# Patient Record
Sex: Female | Born: 1995 | Race: Black or African American | Hispanic: No | Marital: Married | Smoking: Never smoker
Health system: Southern US, Community
[De-identification: ages and names within clinical notes are randomized; demographics above are authoritative.]

## PROBLEM LIST (undated history)

## (undated) ENCOUNTER — Inpatient Hospital Stay (HOSPITAL_COMMUNITY): Payer: Self-pay

## (undated) DIAGNOSIS — N159 Renal tubulo-interstitial disease, unspecified: Secondary | ICD-10-CM

## (undated) DIAGNOSIS — E039 Hypothyroidism, unspecified: Secondary | ICD-10-CM

## (undated) DIAGNOSIS — Z789 Other specified health status: Secondary | ICD-10-CM

## (undated) DIAGNOSIS — E049 Nontoxic goiter, unspecified: Secondary | ICD-10-CM

## (undated) DIAGNOSIS — R519 Headache, unspecified: Secondary | ICD-10-CM

## (undated) HISTORY — DX: Other specified health status: Z78.9

## (undated) HISTORY — PX: WISDOM TOOTH EXTRACTION: SHX21

---
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
## 2013-01-02 ENCOUNTER — Inpatient Hospital Stay (HOSPITAL_COMMUNITY): Payer: Medicaid Other

## 2013-01-02 ENCOUNTER — Encounter (HOSPITAL_COMMUNITY): Payer: Self-pay | Admitting: *Deleted

## 2013-01-02 ENCOUNTER — Inpatient Hospital Stay (HOSPITAL_COMMUNITY)
Admission: AD | Admit: 2013-01-02 | Discharge: 2013-01-02 | Disposition: A | Discharge status: Home / Self Care | Payer: Medicaid Other | Source: Ambulatory Visit | Attending: Obstetrics & Gynecology | Admitting: Obstetrics & Gynecology

## 2013-01-02 DIAGNOSIS — O0932 Supervision of pregnancy with insufficient antenatal care, second trimester: Secondary | ICD-10-CM

## 2013-01-02 DIAGNOSIS — R109 Unspecified abdominal pain: Secondary | ICD-10-CM | POA: Insufficient documentation

## 2013-01-02 DIAGNOSIS — O26879 Cervical shortening, unspecified trimester: Secondary | ICD-10-CM | POA: Insufficient documentation

## 2013-01-02 DIAGNOSIS — O99891 Other specified diseases and conditions complicating pregnancy: Secondary | ICD-10-CM | POA: Insufficient documentation

## 2013-01-02 DIAGNOSIS — O26872 Cervical shortening, second trimester: Secondary | ICD-10-CM

## 2013-01-02 DIAGNOSIS — N949 Unspecified condition associated with female genital organs and menstrual cycle: Secondary | ICD-10-CM | POA: Insufficient documentation

## 2013-01-02 DIAGNOSIS — O0930 Supervision of pregnancy with insufficient antenatal care, unspecified trimester: Secondary | ICD-10-CM | POA: Insufficient documentation

## 2013-01-02 DIAGNOSIS — O4700 False labor before 37 completed weeks of gestation, unspecified trimester: Secondary | ICD-10-CM

## 2013-01-02 HISTORY — DX: Nontoxic goiter, unspecified: E04.9

## 2013-01-02 LAB — WET PREP, GENITAL
Clue Cells Wet Prep HPF POC: NONE SEEN
Trich, Wet Prep: NONE SEEN

## 2013-01-02 LAB — URINALYSIS, ROUTINE W REFLEX MICROSCOPIC
Glucose, UA: NEGATIVE mg/dL
Ketones, ur: NEGATIVE mg/dL
Protein, ur: NEGATIVE mg/dL
Urobilinogen, UA: 0.2 mg/dL (ref 0.0–1.0)

## 2013-01-02 LAB — URINE MICROSCOPIC-ADD ON

## 2013-01-02 MED ORDER — PROGESTERONE 100 MG VA SUPP: 1.0000 | Freq: Every day | VAGINAL | Status: DC

## 2013-01-02 NOTE — MAU Note (Signed)
Started cramping during the night, got worse middle of the night. No bleeding.  Passed ? Mucous plug yesterday, greenish.

## 2013-01-02 NOTE — MAU Provider Note (Signed)
History     CSN: [id number]  Arrival date and time: 01/02/13 0949   None     Chief Complaint  Patient presents with  . Vaginal Discharge  . Abdominal Pain   HPI 17 y.o.  G3P0011 at [redacted]w[redacted]d with cramping starting last night. Has had it on and off for a few weeks. Lower pelvic cramping. Lost mucous plug today. Lasts a few minutes at a time. Through the night. No fever/chills. No nausea/vomiting. No diarrhea/constipation.   First pregnancy normal with vaginal delivery at around 41 weeks. In [place]. Baby had heart issues and seizures but otherwise no complications.  LMP:  08/16/13, regular, 7 days.   OB History   Grav Para Term Preterm Abortions TAB SAB Ect Mult Living   3    1  1   1       Past Medical History  Diagnosis Date  . Complication of anesthesia   . Enlarged thyroid     Past Surgical History  Procedure Laterality Date  . Wisdom tooth extraction      Family History  Problem Relation Age of Onset  . Cancer Mother   . Cancer Maternal Aunt   . Cancer Maternal Grandmother     History  Substance Use Topics  . Smoking status: Never Smoker   . Smokeless tobacco: Never Used  . Alcohol Use: No    Allergies: No Known Allergies  Prescriptions prior to admission  Medication Sig Dispense Refill  . sodium chloride (OCEAN) 0.65 % nasal spray Place 1 spray into the nose as needed for congestion.        ROS  Pertinent pos and neg listed in HPI  Physical Exam   Blood pressure 123/58, pulse 83, temperature 98.3 F (36.8 C), temperature source Oral, resp. rate 18, height 6' (1.829 m), weight 90.719 kg (200 lb), last menstrual period 08/16/2012.  Physical Exam  Constitutional: She is oriented to person, place, and time. She appears well-developed and well-nourished. No distress.  HENT:  Head: Normocephalic and atraumatic.  Eyes: Conjunctivae and EOM are normal.  Neck: Normal range of motion. Neck supple.  Cardiovascular: Normal rate, regular rhythm and normal  heart sounds.   Respiratory: Effort normal and breath sounds normal. No respiratory distress.  GI: Soft. Bowel sounds are normal. There is no tenderness. There is no rebound and no guarding.  Musculoskeletal: She exhibits no edema and no tenderness.  Neurological: She is alert and oriented to person, place, and time.  Skin: Skin is warm and dry.  Psychiatric: She has a normal mood and affect.   FHTs by handheld doppler:  152  Results for orders placed during the hospital encounter of 01/02/13 (from the past 24 hour(s))  URINALYSIS, ROUTINE W REFLEX MICROSCOPIC     Status: Abnormal   Collection Time    01/02/13 10:07 AM      Result Value Range   Color, Urine YELLOW  YELLOW   APPearance CLEAR  CLEAR   Specific Gravity, Urine 1.025  1.005 - 1.030   pH 6.5  5.0 - 8.0   Glucose, UA NEGATIVE  NEGATIVE mg/dL   Hgb urine dipstick NEGATIVE  NEGATIVE   Bilirubin Urine NEGATIVE  NEGATIVE   Ketones, ur NEGATIVE  NEGATIVE mg/dL   Protein, ur NEGATIVE  NEGATIVE mg/dL   Urobilinogen, UA 0.2  0.0 - 1.0 mg/dL   Nitrite NEGATIVE  NEGATIVE   Leukocytes, UA SMALL (*) NEGATIVE  URINE MICROSCOPIC-ADD ON     Status: None   Collection  Time    01/02/13 10:07 AM      Result Value Range   Squamous Epithelial / LPF RARE  RARE   WBC, UA 0-2  <3 WBC/hpf   RBC / HPF 0-2  <3 RBC/hpf   Bacteria, UA RARE  RARE   Urine-Other MUCOUS PRESENT    WET PREP, GENITAL     Status: Abnormal   Collection Time    01/02/13 11:35 AM      Result Value Range   Yeast Wet Prep HPF POC NONE SEEN  NONE SEEN   Trich, Wet Prep NONE SEEN  NONE SEEN   Clue Cells Wet Prep HPF POC NONE SEEN  NONE SEEN   WBC, Wet Prep HPF POC MANY (*) NONE SEEN     MAU Course  Procedures   Assessment and Plan  17 y.o. .[id number] at [redacted]w[redacted]d by LMP with no prenatal care: - Lower pelvic pain/cramping with shortened cervix - F/U GC, chlamydia - Scheduled for detailed OB sono tomorrow 10 AM - Scheduled for [facility] clinic appt 5/12 (Monday) at 10:15  AM. - Prometrium vaginally qhs.  May be candidate for cerclage. - Discussed risks of short cervix and preterm delivery. Pt instructed about pelvic rest, no prolonged standing/lifting/bending until further notice.  - Discharge home.   Napoleon Form 01/02/2013, 11:22 AM

## 2013-01-03 ENCOUNTER — Encounter (HOSPITAL_COMMUNITY): Payer: Self-pay | Admitting: *Deleted

## 2013-01-03 ENCOUNTER — Inpatient Hospital Stay (HOSPITAL_COMMUNITY)
Admission: AD | Admit: 2013-01-03 | Discharge: 2013-01-04 | DRG: 782 | Disposition: A | Discharge status: Home / Self Care | Payer: Medicaid Other | Source: Ambulatory Visit | Attending: Obstetrics & Gynecology | Admitting: Obstetrics & Gynecology

## 2013-01-03 ENCOUNTER — Ambulatory Visit (HOSPITAL_COMMUNITY)
Admit: 2013-01-03 | Discharge: 2013-01-03 | Disposition: A | Discharge status: Home / Self Care | Payer: Medicaid Other | Attending: Family Medicine | Admitting: Family Medicine

## 2013-01-03 ENCOUNTER — Other Ambulatory Visit (HOSPITAL_COMMUNITY): Payer: Self-pay | Admitting: Family Medicine

## 2013-01-03 DIAGNOSIS — O0930 Supervision of pregnancy with insufficient antenatal care, unspecified trimester: Secondary | ICD-10-CM

## 2013-01-03 DIAGNOSIS — O0932 Supervision of pregnancy with insufficient antenatal care, second trimester: Secondary | ICD-10-CM

## 2013-01-03 DIAGNOSIS — O3432 Maternal care for cervical incompetence, second trimester: Secondary | ICD-10-CM

## 2013-01-03 DIAGNOSIS — O26879 Cervical shortening, unspecified trimester: Principal | ICD-10-CM

## 2013-01-03 DIAGNOSIS — O3430 Maternal care for cervical incompetence, unspecified trimester: Secondary | ICD-10-CM | POA: Diagnosis present

## 2013-01-03 LAB — DIFFERENTIAL
Basophils Absolute: 0 10*3/uL (ref 0.0–0.1)
Basophils Relative: 0 % (ref 0–1)
Eosinophils Absolute: 0.2 10*3/uL (ref 0.0–0.7)
Monocytes Absolute: 0.7 10*3/uL (ref 0.1–1.0)
Monocytes Relative: 6 % (ref 3–12)
Neutrophils Relative %: 75 % (ref 43–77)

## 2013-01-03 LAB — CBC
Hemoglobin: 11.4 g/dL — ABNORMAL LOW (ref 12.0–15.0)
MCH: 31.2 pg (ref 26.0–34.0)
MCHC: 34 g/dL (ref 30.0–36.0)
RDW: 13.8 % (ref 11.5–15.5)

## 2013-01-03 LAB — TYPE AND SCREEN: Antibody Screen: NEGATIVE

## 2013-01-03 LAB — OB RESULTS CONSOLE GC/CHLAMYDIA
Chlamydia: NEGATIVE
Gonorrhea: NEGATIVE

## 2013-01-03 LAB — GC/CHLAMYDIA PROBE AMP: GC Probe RNA: NEGATIVE

## 2013-01-03 IMAGING — US US OB COMP +14 WK
2 series · 12 of 28 positions shown · non-contrast
Comparison: none

[Series 1: us ob comp +14 wk · 10 of 87 slices shown (1 of 2)]
[im 4/87]
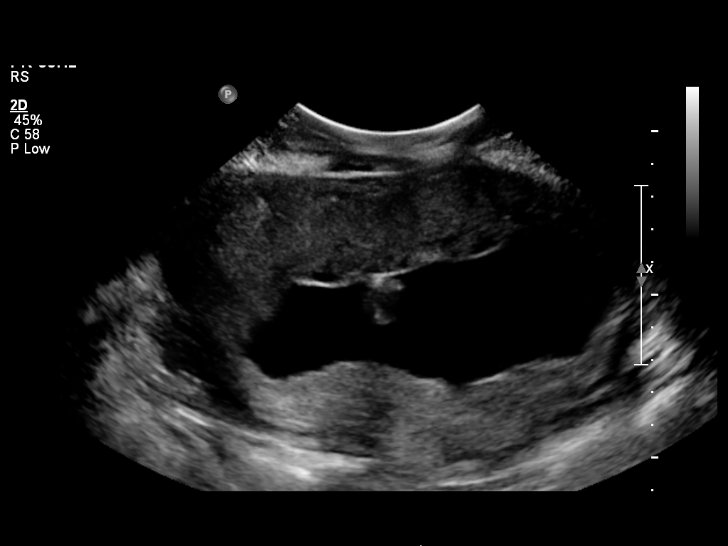
[im 12/87]
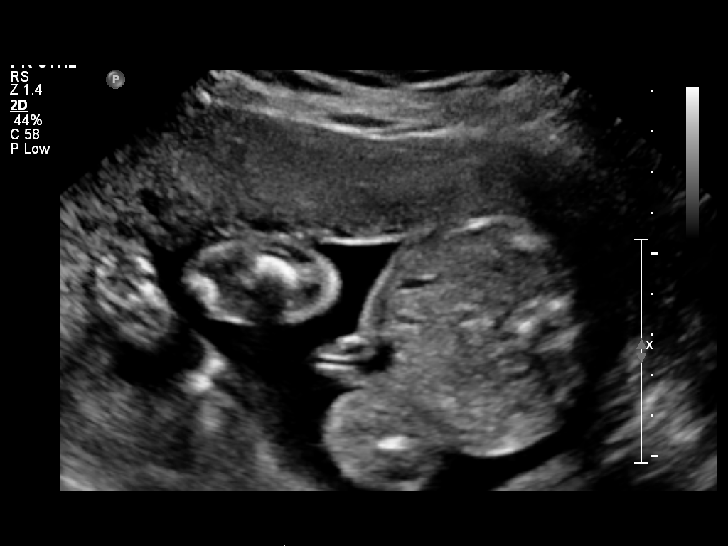
[im 19/87]
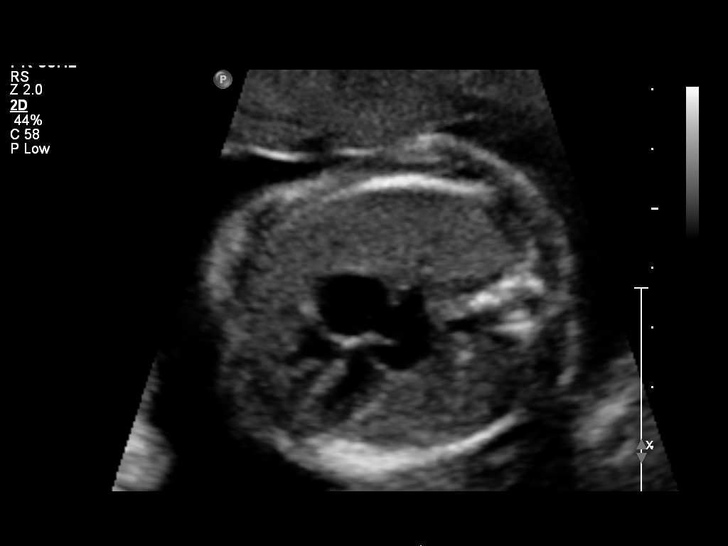
[im 30/87]
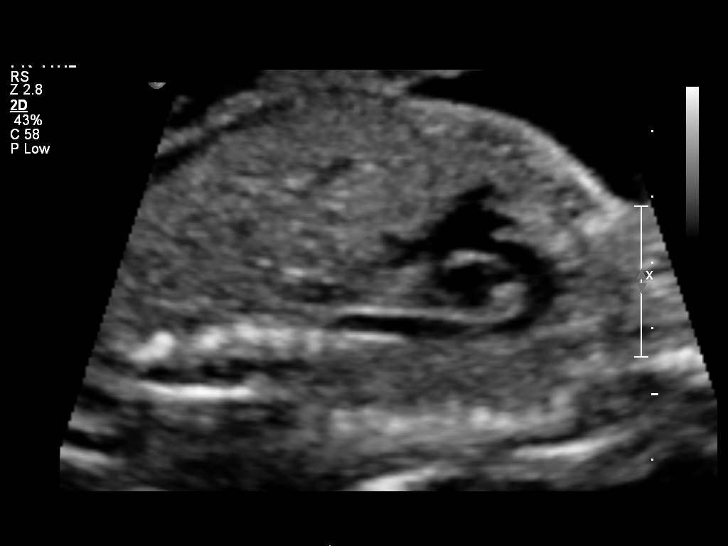
[im 38/87]
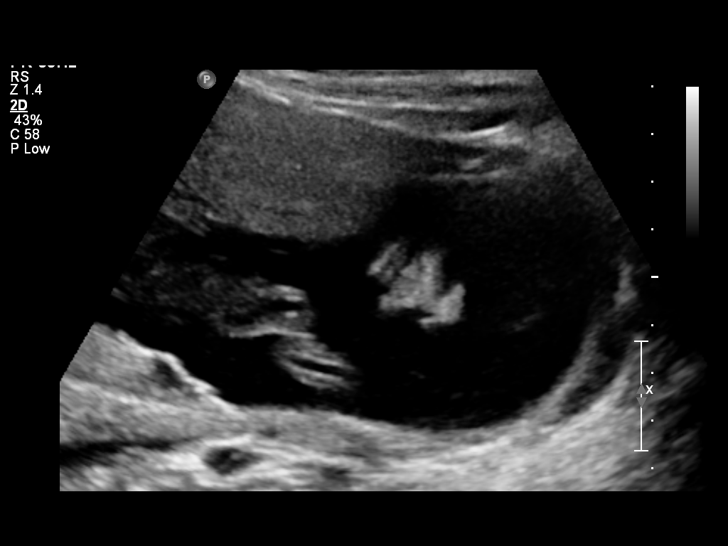
[im 45/87]
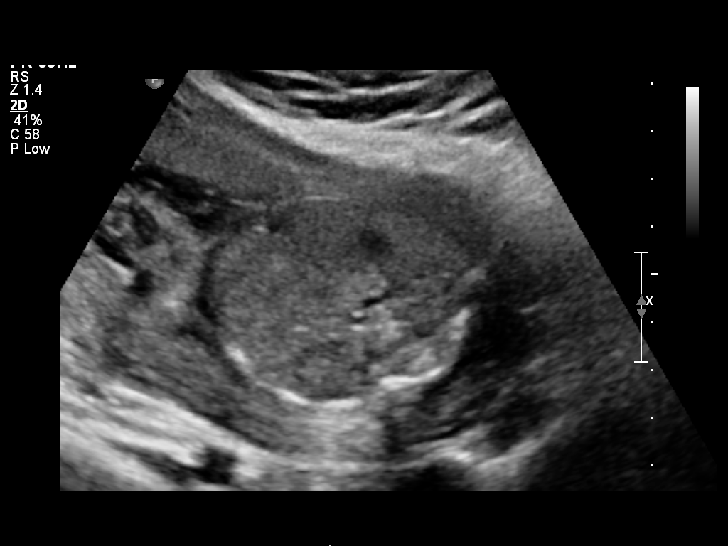
[im 57/87]
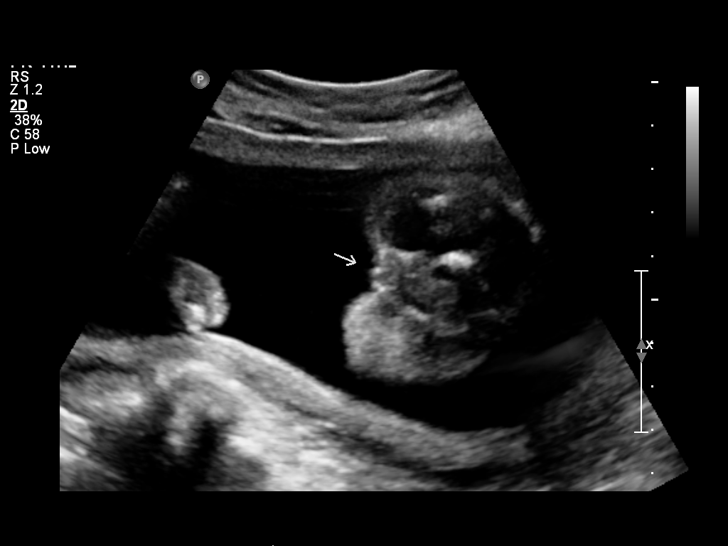
[im 64/87]
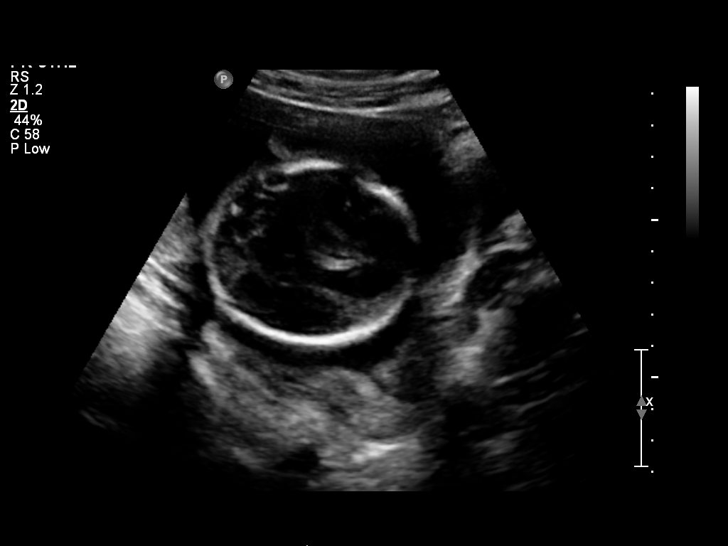
[im 72/87]
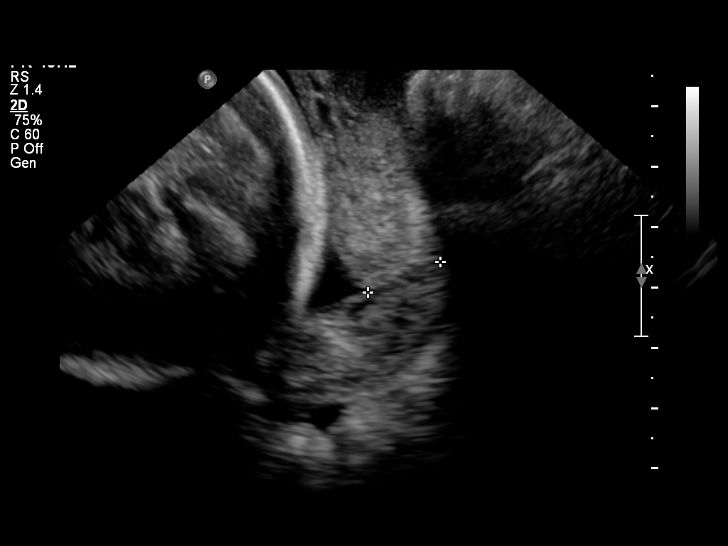
[im 83/87]
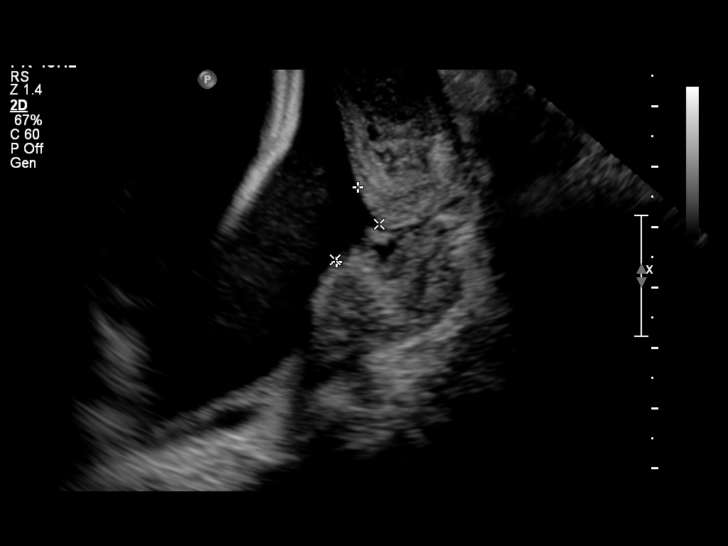

[Series 1: us ob comp +14 wk · 2 of 15 slices shown (2 of 2)]
[im 1/15]
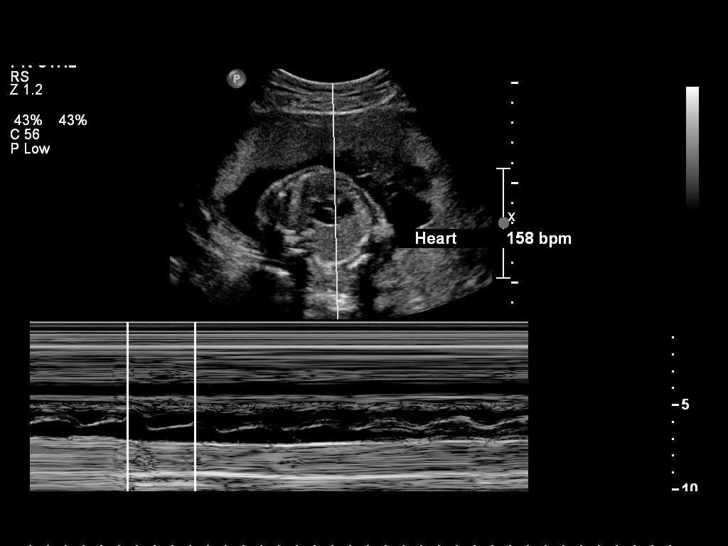
[im 10/15]
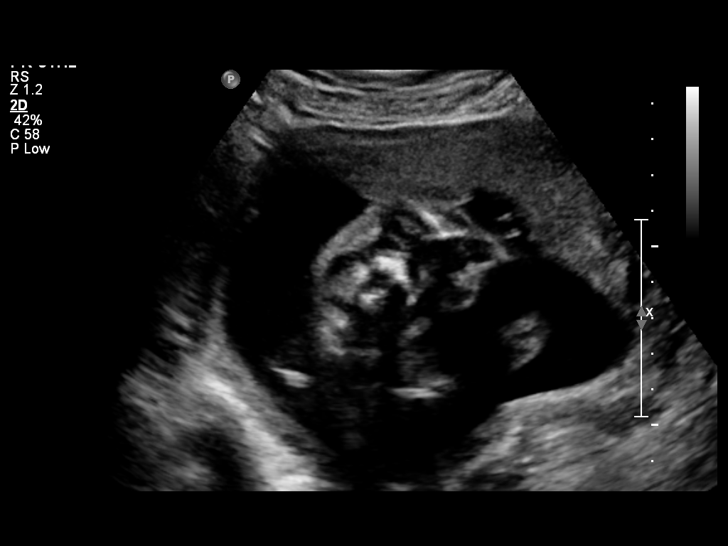

[12 of 28 positions shown; findings below may reference images not displayed]

OBSTETRICS REPORT
                      (Signed Final [DATE] [DATE])

Service(s) Provided

 US OB COMP + 14 WK                                    76805.1
 US OB TRANSVAGINAL                                    76817.0
Indications

 Unsure of LMP;  Establish Gestational [AGE]
 No or Little Prenatal Care                            [MR]
 Cervical shortening
Fetal Evaluation

 Num Of Fetuses:    1
 Fetal Heart Rate:  158                         bpm
 Cardiac Activity:  Observed
 Presentation:      Cephalic
 Placenta:          Anterior, above cervical os
 P. Cord            Visualized, central
 Insertion:

 Amniotic Fluid
 AFI FV:      Subjectively within normal limits
                                             Larg Pckt:     4.8  cm
Biometry

 BPD:     57.3  mm    G. Age:   23w 4d                CI:        74.32   70 - 86
                                                      FL/HC:      20.0   18.7 -

 HC:       211  mm    G. Age:   23w 1d       17  %    HC/AC:      1.06   1.05 -

 AC:     198.6  mm    G. Age:   24w 4d       67  %    FL/BPD:     73.8   71 - 87
 FL:      42.3  mm    G. Age:   23w 6d       41  %    FL/AC:      21.3   20 - 24

 Est. FW:     657  gm      1 lb 7 oz     58  %
Gestational Age

 LMP:           20w 0d       Date:   [DATE]                 EDD:   [DATE]
 U/S Today:     23w 5d                                        EDD:   [DATE]
 Best:          23w 5d    Det. By:   U/S ([DATE])           EDD:   [DATE]
Anatomy

 Cranium:          Appears normal         Aortic Arch:      Appears normal
 Fetal Cavum:      Appears normal         Ductal Arch:      Appears normal
 Ventricles:       Appears normal         Diaphragm:        Appears normal
 Choroid Plexus:   Appears normal         Stomach:          Appears normal, left
                                                            sided
 Cerebellum:       Appears normal         Abdomen:          Appears normal
 Posterior Fossa:  Appears normal         Abdominal Wall:   Appears nml (cord
                                                            insert, abd wall)
 Nuchal Fold:      Not applicable (>20    Cord Vessels:     Appears normal (3
                   wks GA)                                  vessel cord)
 Face:             Appears normal         Kidneys:          Appear normal
                   (orbits and profile)
 Lips:             Appears normal         Bladder:          Appears normal
 Heart:            Appears normal         Spine:            Appears normal
                   (4CH, axis, and
                   situs)
 RVOT:             Appears normal         Lower             Appears normal
                                          Extremities:
 LVOT:             Appears normal         Upper             Appears normal
                                          Extremities:

 Other:  Female gender. Heels visualized. Technically difficult due to fetal
         position.
Cervix Uterus Adnexa

 Cervical Length:   1.2       cm       Funnel Width:   1.3       cm
 Funnel Length:     0.9       cm

 Cervix:       Measured transvaginally. Funneling of internal os
               noted.

 Left Ovary:   Within normal limits.
 Right Ovary:  Within normal limits.
Impression

 Single living IUP with US Gest. Age of 23w 5d. This is ahead
 of LMP;  recommend assigned EDD by US of [DATE].
 No fetal anomalies seen involving visualized anatomy.
 Short cervix again noted, measuring 1.2cm in length.

## 2013-01-03 MED ORDER — PRENATAL MULTIVITAMIN CH
1.0000 | ORAL_TABLET | Freq: Every day | ORAL | Status: DC
  Administered 2013-01-03: 1 via ORAL
  Filled 2013-01-03: qty 1

## 2013-01-03 MED ORDER — SODIUM CHLORIDE 0.9 % IJ SOLN: 3.0000 mL | Freq: Two times a day (BID) | INTRAMUSCULAR | Status: DC

## 2013-01-03 MED ORDER — CALCIUM CARBONATE ANTACID 500 MG PO CHEW
2.0000 | CHEWABLE_TABLET | ORAL | Status: DC | PRN
  Filled 2013-01-03: qty 2

## 2013-01-03 MED ORDER — SODIUM CHLORIDE 0.9 % IV SOLN: 250.0000 mL | INTRAVENOUS | Status: DC | PRN

## 2013-01-03 MED ORDER — BETAMETHASONE SOD PHOS & ACET 6 (3-3) MG/ML IJ SUSP
12.0000 mg | INTRAMUSCULAR | Status: AC
  Administered 2013-01-03 – 2013-01-04 (×2): 12 mg via INTRAMUSCULAR
  Filled 2013-01-03 (×2): qty 2

## 2013-01-03 MED ORDER — PRENATAL MULTIVITAMIN CH
1.0000 | ORAL_TABLET | Freq: Every day | ORAL | Status: DC
  Filled 2013-01-03 (×2): qty 1

## 2013-01-03 MED ORDER — DOCUSATE SODIUM 100 MG PO CAPS
100.0000 mg | ORAL_CAPSULE | Freq: Every day | ORAL | Status: DC
  Filled 2013-01-03 (×3): qty 1

## 2013-01-03 MED ORDER — ACETAMINOPHEN 325 MG PO TABS: 650.0000 mg | ORAL_TABLET | ORAL | Status: DC | PRN

## 2013-01-03 MED ORDER — SODIUM CHLORIDE 0.9 % IJ SOLN: 3.0000 mL | INTRAMUSCULAR | Status: DC | PRN

## 2013-01-03 MED ORDER — PROGESTERONE MICRONIZED 200 MG PO CAPS
200.0000 mg | ORAL_CAPSULE | Freq: Every day | ORAL | Status: DC
  Administered 2013-01-03: 200 mg via VAGINAL
  Filled 2013-01-03 (×2): qty 1

## 2013-01-03 MED ORDER — BETAMETHASONE SOD PHOS & ACET 6 (3-3) MG/ML IJ SUSP
12.0000 mg | Freq: Once | INTRAMUSCULAR | Status: DC
  Filled 2013-01-03: qty 2

## 2013-01-03 MED ORDER — ZOLPIDEM TARTRATE 5 MG PO TABS: 5.0000 mg | ORAL_TABLET | Freq: Every evening | ORAL | Status: DC | PRN

## 2013-01-03 NOTE — H&P (Signed)
Gary Fleet   History of Present Illness: Cassandra Smith is a 17 y.o. G3P1011 at [redacted]w[redacted]d admitted for cervical insufficiency. Seen yesterday in MAU for abdominal pain and vaginal discharge. Found to have cervical length of 1.8 cm. Sent home with Rx for prometrium. Scheduled for complete ultrasound today and clinic appt 5/12 to discuss possible cerclage. Dating by patient's LMP gives gestational age of [redacted] wks today. She is sure of LMP - regular periods, tracking, no birth control, LMP was normal in length. However, seen for ultrasound today and dating of [redacted]w[redacted]d. Cervical length today was 1.2 cm. Sent to MAU for further eval.   Today she denies bleeding, cramping, loss of fluid. Fetal movement is normal. NO prenatal care to date. Prior pregnancy with SVD, no complications.  0 Fetal presentation is cephalic by today's sono.  Patient reports the fetal movement as active. Patient reports uterine contraction  activity as none Patient reports  vaginal bleeding as none. Patient describes fluid per vagina as none  Patient Active Problem List   Diagnosis Date Noted  . Late prenatal care 01/03/2013  . Cervical shortening complicating pregnancy 01/03/2013   Past Medical History: Past Medical History  Diagnosis Date  . Complication of anesthesia   . Enlarged thyroid     Past Surgical History: Past Surgical History  Procedure Laterality Date  . Wisdom tooth extraction      Obstetrical History: OB History   Grav Para Term Preterm Abortions TAB SAB Ect Mult Living   3 1 1  0 1 0 1 0 0 1      Social History: History   Social History  . Marital Status: Single    Spouse Name: N/A    Number of Children: N/A  . Years of Education: N/A   Social History Main Topics  . Smoking status: Never Smoker   . Smokeless tobacco: Never Used  . Alcohol Use: No  . Drug Use: No  . Sexually Active: Yes   Other Topics Concern  . None   Social History Narrative  . None    Family  History: Family History  Problem Relation Age of Onset  . Cancer Mother   . Cancer Maternal Aunt   . Cancer Maternal Grandmother     Allergies: No Known Allergies  Prescriptions prior to admission  Medication Sig Dispense Refill  . Prenatal Vit-Fe Fumarate-FA (PRENATAL MULTIVITAMIN) TABS Take 1 tablet by mouth at bedtime.      . Progesterone 100 MG SUPP Place 1 suppository (100 mg total) vaginally at bedtime.  30 each  6    Review of Systems - No fever, chills, nausea, vomiting, diarrhea, headache, vision changes. Other pertinent pos and neg stated in HPI.  Vitals:  LMP 08/16/2012 Filed Vitals:   01/03/13 2027  BP: 117/63  Pulse: 77  Temp:   Resp:     Physical Examination:  GEN:  WNWD, no distress HEENT:  NCAT, EOMI, conjunctiva clear NECK:  Supple, non-tender, no thyromegaly, trachea midline CV: RRR, no murmur RESP:  CTAB ABD:  Soft, non-tender, no guarding or rebound, normal bowel sounds EXTREM:  Warm, well perfused, no edema or tenderness NEURO:  Alert, oriented, no focal deficits GU:  Cervix closed, short (<50%), high, no CMT. Normal external genitalia.  Membranes: intact  Fetal Monitoring:   150 by handheld doppler.  Labs:  Results for orders placed during the hospital encounter of 01/03/13 (from the past 24 hour(s))  HEPATITIS B SURFACE ANTIGEN   Collection Time  01/03/13  1:30 PM      Result Value Range   Hepatitis B Surface Ag NEGATIVE  NEGATIVE  RPR   Collection Time    01/03/13  1:30 PM      Result Value Range   RPR NON REACTIVE  NON REACTIVE  CBC   Collection Time    01/03/13  1:30 PM      Result Value Range   WBC 12.6 (*) 4.0 - 10.5 K/uL   RBC 3.65 (*) 3.87 - 5.11 MIL/uL   Hemoglobin 11.4 (*) 12.0 - 15.0 g/dL   HCT 11.9 (*) 14.7 - 82.9 %   MCV 91.8  78.0 - 100.0 fL   MCH 31.2  26.0 - 34.0 pg   MCHC 34.0  30.0 - 36.0 g/dL   RDW 56.2  13.0 - 86.5 %   Platelets 264  150 - 400 K/uL  DIFFERENTIAL   Collection Time    01/03/13  1:30 PM       Result Value Range   Neutrophils Relative 75  43 - 77 %   Neutro Abs 9.5 (*) 1.7 - 7.7 K/uL   Lymphocytes Relative 18  12 - 46 %   Lymphs Abs 2.2  0.7 - 4.0 K/uL   Monocytes Relative 6  3 - 12 %   Monocytes Absolute 0.7  0.1 - 1.0 K/uL   Eosinophils Relative 1  0 - 5 %   Eosinophils Absolute 0.2  0.0 - 0.7 K/uL   Basophils Relative 0  0 - 1 %   Basophils Absolute 0.0  0.0 - 0.1 K/uL  TYPE AND SCREEN   Collection Time    01/03/13  4:30 PM      Result Value Range   ABO/RH(D) O POS     Antibody Screen NEG     Sample Expiration 01/06/2013    ABO/RH   Collection Time    01/03/13  4:30 PM      Result Value Range   ABO/RH(D) O POS      Imaging Studies: Complete OB ultrasound > 14 wks Cephalic, GA [redacted]w[redacted]d, normal fluid. Cervical length 1.2 cm, funnel 0.9 long x 1.3 cm wide Normal anatomy    . betamethasone acetate-betamethasone sodium phosphate  12 mg Intramuscular Q24H  . betamethasone acetate-betamethasone sodium phosphate  12 mg Intramuscular Once  . docusate sodium  100 mg Oral Daily  . prenatal multivitamin  1 tablet Oral Q1200  . progesterone  200 mg Vaginal QHS  . sodium chloride  3 mL Intravenous Q12H   I have reviewed the patient's medications.  ASSESSMENT: 17 y.o. G3P1011 at [redacted]w[redacted]d with  Patient Active Problem List   Diagnosis Date Noted  . Late prenatal care 01/03/2013  . Cervical shortening complicating pregnancy 01/03/2013   PLAN: Admit to Antenatal Betamethasone Monitor x 24 hours. Recheck cervix tomorrow Prometrium (vaginal progesterone 200 mg nightly) NICU consult Possibly home after BMZ.  Napoleon Form, MD

## 2013-01-04 DIAGNOSIS — IMO0002 Reserved for concepts with insufficient information to code with codable children: Secondary | ICD-10-CM

## 2013-01-04 DIAGNOSIS — O3440 Maternal care for other abnormalities of cervix, unspecified trimester: Secondary | ICD-10-CM

## 2013-01-04 MED ORDER — PROGESTERONE MICRONIZED 200 MG PO CAPS: 200.0000 mg | ORAL_CAPSULE | Freq: Every day | ORAL | Status: DC

## 2013-01-04 NOTE — Discharge Summary (Signed)
Physician Discharge Summary  Patient ID: Cassandra Smith MRN: [id number] DOB/AGE: 1996/01/24 16 y.o.  Admit date: 01/03/2013 Discharge date: 01/04/2013  Admission Diagnoses: Cervical insufficiency   Discharge Diagnoses: Cervical insufficiency  Active Problems:   Late prenatal care   Cervical shortening complicating pregnancy   Discharged Condition: stable  Hospital Course: Cassandra Smith is a 17 y.o. G3P1011 with no PNC at [redacted]w[redacted]d admitted for cervical insufficiency. Seen 5/8 in MAU for abdominal pain and vaginal discharge. Found to have cervical length of 1.8 cm. Sent home with Rx for prometrium. Complete ultrasound 5/9 revealed EGA of [redacted]w[redacted]d and CL of 1.2 cm, so she was admitted to antenatal for BMZ x 2.  Cx on admission was closed.  Repeat SVE today after 2nd dose of BMZ, cx still closed.   Consults: None  Significant Diagnostic Studies: none  Treatments: BMZ x 2, prometrium 200mg  pv q hs  Discharge Exam: Blood pressure 110/59, pulse 74, temperature 98.3 F (36.8 C), temperature source Oral, resp. rate 18, height 6' (1.829 m), weight 90.719 kg (200 lb), last menstrual period 08/16/2012.  Reassuring tracing this am  Pt denies uc's, vb, lof.  Reports good fm.   Disposition: 01-Home or Self Care   Future Appointments Provider Department Dept Phone   01/06/2013 10:15 AM Catalina Antigua, MD [facility] [phone]       Medication List    STOP taking these medications       Progesterone 100 MG Supp      TAKE these medications       prenatal multivitamin Tabs  Take 1 tablet by mouth at bedtime.     progesterone 200 MG capsule  Commonly known as:  PROMETRIUM  Take 1 capsule (200 mg total) by mouth at bedtime. Vaginally (not by mouth)           Follow-up Information   Follow up with [facility]. Schedule an appointment as soon as possible for a visit in 1 week.   Contact information:   [address] [place]  [address] [phone]     Reviewed poc w/ Dr. Marice Potter  Signed: Marge Duncans 01/04/2013, 2:45 PM

## 2013-01-04 NOTE — Progress Notes (Signed)
Patient ID: Cassandra Smith, female   DOB: [birth year], 17 y.o.   MRN: [id number] FACULTY PRACTICE ANTEPARTUM(COMPREHENSIVE) NOTE  Cassandra Smith is a 17 y.o. G3P1011 at [redacted]w[redacted]d  who is admitted for preterm cervical shortening.   Length of Stay:  1  Days  Subjective: No complaints Patient reports the fetal movement as active. Patient reports uterine contraction  activity as none. Patient reports  vaginal bleeding as none. Patient describes fluid per vagina as None.  Vitals:  Blood pressure 117/63, pulse 77, temperature 98.7 F (37.1 C), temperature source Oral, resp. rate 20, last menstrual period 08/16/2012. Physical Examination:  General appearance - alert, well appearing, and in no distress Abdomen - gravid, NT Extremities - no edema, redness or tenderness in the calves or thighs, Homan's sign negative bilaterally  Fetal Monitoring:  Baseline: 150 bpm, Variability: Good {> 6 bpm), Accelerations: Reactive and Decelerations: Absent  Labs:  Results for orders placed during the hospital encounter of 01/03/13 (from the past 24 hour(s))  HEPATITIS B SURFACE ANTIGEN   Collection Time    01/03/13  1:30 PM      Result Value Range   Hepatitis B Surface Ag NEGATIVE  NEGATIVE  RPR   Collection Time    01/03/13  1:30 PM      Result Value Range   RPR NON REACTIVE  NON REACTIVE  CBC   Collection Time    01/03/13  1:30 PM      Result Value Range   WBC 12.6 (*) 4.0 - 10.5 K/uL   RBC 3.65 (*) 3.87 - 5.11 MIL/uL   Hemoglobin 11.4 (*) 12.0 - 15.0 g/dL   HCT 36.6 (*) 44.0 - 34.7 %   MCV 91.8  78.0 - 100.0 fL   MCH 31.2  26.0 - 34.0 pg   MCHC 34.0  30.0 - 36.0 g/dL   RDW 42.5  95.6 - 38.7 %   Platelets 264  150 - 400 K/uL  DIFFERENTIAL   Collection Time    01/03/13  1:30 PM      Result Value Range   Neutrophils Relative 75  43 - 77 %   Neutro Abs 9.5 (*) 1.7 - 7.7 K/uL   Lymphocytes Relative 18  12 - 46 %   Lymphs Abs 2.2  0.7 - 4.0 K/uL   Monocytes Relative 6  3 - 12 %   Monocytes Absolute 0.7  0.1 - 1.0 K/uL   Eosinophils Relative 1  0 - 5 %   Eosinophils Absolute 0.2  0.0 - 0.7 K/uL   Basophils Relative 0  0 - 1 %   Basophils Absolute 0.0  0.0 - 0.1 K/uL  TYPE AND SCREEN   Collection Time    01/03/13  4:30 PM      Result Value Range   ABO/RH(D) O POS     Antibody Screen NEG     Sample Expiration 01/06/2013    ABO/RH   Collection Time    01/03/13  4:30 PM      Result Value Range   ABO/RH(D) O POS      Imaging Studies:    None pending  Medications:  Scheduled . betamethasone acetate-betamethasone sodium phosphate  12 mg Intramuscular Q24H  . docusate sodium  100 mg Oral Daily  . prenatal multivitamin  1 tablet Oral QHS  . progesterone  200 mg Vaginal QHS  . sodium chloride  3 mL Intravenous Q12H   I have reviewed the patient's current medications.  ASSESSMENT: Patient  Active Problem List   Diagnosis Date Noted  . Late prenatal care 01/03/2013  . Cervical shortening complicating pregnancy 01/03/2013     PLAN:  17 y.o. G3P1011 at [redacted]w[redacted]d  who is admitted for preterm cervical shortening.  No evidence of preterm labor.    1-Continue Betamethasone 2-cervical check after 2nd dose 3-Continue Prometrium 4-Consider d/c home if no cervical change 5-NST reative  Lenita Peregrina H. 01/04/2013,6:45 AM

## 2013-01-06 ENCOUNTER — Encounter: Payer: Self-pay | Admitting: Obstetrics and Gynecology

## 2013-01-06 LAB — CULTURE, BETA STREP (GROUP B ONLY)

## 2013-01-06 LAB — RUBELLA SCREEN: Rubella: 2.28 Index — ABNORMAL HIGH (ref <0.90)

## 2013-01-06 NOTE — MAU Provider Note (Signed)
Attestation of Attending Supervision of Advanced Practitioner (CNM/NP/Fellow): Evaluation and management procedures were performed by the Advanced Practitioner under my supervision and collaboration.  I have reviewed the Advanced Practitioner's note and chart, and I agree with the management and plan.  [name], [name] 10:50 AM

## 2013-01-07 ENCOUNTER — Other Ambulatory Visit (HOSPITAL_COMMUNITY): Payer: Self-pay

## 2013-01-09 ENCOUNTER — Ambulatory Visit (INDEPENDENT_AMBULATORY_CARE_PROVIDER_SITE_OTHER): Payer: Medicaid Other | Admitting: Obstetrics & Gynecology

## 2013-01-09 ENCOUNTER — Encounter: Payer: Self-pay | Admitting: Obstetrics & Gynecology

## 2013-01-09 VITALS — BP 122/68 | Temp 98.0°F | Wt 208.2 lb

## 2013-01-09 DIAGNOSIS — O26879 Cervical shortening, unspecified trimester: Secondary | ICD-10-CM

## 2013-01-09 DIAGNOSIS — O0930 Supervision of pregnancy with insufficient antenatal care, unspecified trimester: Secondary | ICD-10-CM

## 2013-01-09 DIAGNOSIS — O26872 Cervical shortening, second trimester: Secondary | ICD-10-CM

## 2013-01-09 LAB — POCT URINALYSIS DIP (DEVICE)
Bilirubin Urine: NEGATIVE
Glucose, UA: NEGATIVE mg/dL
Nitrite: NEGATIVE

## 2013-01-09 NOTE — Progress Notes (Signed)
New OB teen pregnancy, needs PNV  Subjective:    Cassandra Smith is a [id number] [redacted]w[redacted]d being seen today for her first obstetrical visit.  Her obstetrical history is significant for teen pregnancy. Patient does intend to breast feed. Pregnancy history fully reviewed.  Patient reports no complaints.  Filed Vitals:   01/09/13 0958  BP: 122/68  Temp: 98 F (36.7 C)  Weight: 208 lb 3.2 oz (94.439 kg)    HISTORY: OB History   Grav Para Term Preterm Abortions TAB SAB Ect Mult Living   3 1 1  0 1 0 1 0 0 1     # Outc Date GA Lbr Len/2nd Wgt Sex Del Anes PTL Lv   1 TRM 11/12 [redacted]w[redacted]d  6lb4oz(2.835kg) F SVD EPI No Yes   2 SAB 2013 [redacted]w[redacted]d          3 CUR              Past Medical History  Diagnosis Date  . Enlarged thyroid   . Medical history non-contributory    Past Surgical History  Procedure Laterality Date  . Wisdom tooth extraction     Family History  Problem Relation Age of Onset  . Cancer Mother     kidney and gallbladder  . Cancer Maternal Aunt     ovarian  . Cancer Maternal Grandmother     ovarian     Exam    Uterus:  Fundal Height: 24 cm  Pelvic Exam:    Perineum: No Hemorrhoids   Vulva: normal   Vagina:  normal mucosa   pH:     Cervix: no lesions   Adnexa: normal adnexa   Bony Pelvis: average  System: Breast:  normal appearance, no masses or tenderness   Skin: normal coloration and turgor, no rashes    Neurologic: oriented, normal   Extremities: normal strength, tone, and muscle mass   HEENT neck supple with midline trachea   Mouth/Teeth mucous membranes moist, pharynx normal without lesions and dental hygiene good   Neck supple   Cardiovascular: regular rate and rhythm, no murmurs or gallops   Respiratory:  appears well, vitals normal, no respiratory distress, acyanotic, normal RR, neck free of mass or lymphadenopathy, chest clear, no wheezing, crepitations, rhonchi, normal symmetric air entry   Abdomen: soft, non-tender; bowel sounds normal; no masses,   no organomegaly   Urinary: urethral meatus normal      Assessment:    Pregnancy: [id number] Patient Active Problem List   Diagnosis Date Noted  . Late prenatal care 01/03/2013  . Cervical shortening complicating pregnancy 01/03/2013        Plan:     Initial labs drawn. Prenatal vitamins. Problem list reviewed and updated. Genetic Screening discussed too late  Ultrasound discussed; fetal survey: requested.  Follow up in 2 weeks. 50% of 30 min visit spent on counseling and coordination of care.      ARNOLD,JAMES 01/09/2013

## 2013-01-09 NOTE — H&P (Signed)
Pt seen and examined.  Agree with above note.  Jozef Eisenbeis H. 01/09/2013 2:07 PM

## 2013-01-09 NOTE — Progress Notes (Signed)
Pulse: 78

## 2013-01-09 NOTE — Progress Notes (Signed)
Nutrition note: 1st visit consult Pt has gained 28.2# @ [redacted]w[redacted]d, which is > expected. Pt reports eating 3 meals & 2-3 snacks/d.  Pt is taking PNV. Pt reports no N&V or heartburn. NKFA. Pt received verbal & written education on general nutrition during pregnancy. Discussed importance of BF. Discussed wt gain goals of 25-35# or 1#/ wk. Pt agrees to cont. taking PNV. Pt does not have WIC but plans to apply in [place]. Pt is unsure about breastfeeding. F/u if referred Blondell Reveal, MS, RD, LDN

## 2013-01-16 ENCOUNTER — Encounter: Payer: Self-pay | Admitting: *Deleted

## 2013-01-30 ENCOUNTER — Encounter: Payer: Medicaid Other | Admitting: Family Medicine

## 2013-02-19 ENCOUNTER — Inpatient Hospital Stay (HOSPITAL_COMMUNITY)
Admission: AD | Admit: 2013-02-19 | Discharge: 2013-02-20 | Disposition: A | Discharge status: Home / Self Care | Payer: Medicaid Other | Source: Ambulatory Visit | Attending: Obstetrics and Gynecology | Admitting: Obstetrics and Gynecology

## 2013-02-19 ENCOUNTER — Inpatient Hospital Stay (HOSPITAL_COMMUNITY): Payer: Medicaid Other

## 2013-02-19 ENCOUNTER — Encounter (HOSPITAL_COMMUNITY): Payer: Self-pay

## 2013-02-19 DIAGNOSIS — O4700 False labor before 37 completed weeks of gestation, unspecified trimester: Secondary | ICD-10-CM | POA: Insufficient documentation

## 2013-02-19 DIAGNOSIS — O2390 Unspecified genitourinary tract infection in pregnancy, unspecified trimester: Secondary | ICD-10-CM | POA: Insufficient documentation

## 2013-02-19 DIAGNOSIS — R109 Unspecified abdominal pain: Secondary | ICD-10-CM | POA: Insufficient documentation

## 2013-02-19 DIAGNOSIS — O2343 Unspecified infection of urinary tract in pregnancy, third trimester: Secondary | ICD-10-CM

## 2013-02-19 DIAGNOSIS — N390 Urinary tract infection, site not specified: Secondary | ICD-10-CM | POA: Insufficient documentation

## 2013-02-19 DIAGNOSIS — O26879 Cervical shortening, unspecified trimester: Secondary | ICD-10-CM | POA: Insufficient documentation

## 2013-02-19 DIAGNOSIS — O4703 False labor before 37 completed weeks of gestation, third trimester: Secondary | ICD-10-CM

## 2013-02-19 DIAGNOSIS — O99891 Other specified diseases and conditions complicating pregnancy: Secondary | ICD-10-CM | POA: Insufficient documentation

## 2013-02-19 DIAGNOSIS — M549 Dorsalgia, unspecified: Secondary | ICD-10-CM | POA: Insufficient documentation

## 2013-02-19 LAB — URINALYSIS, ROUTINE W REFLEX MICROSCOPIC
Bilirubin Urine: NEGATIVE
Ketones, ur: NEGATIVE mg/dL
Nitrite: NEGATIVE
Protein, ur: NEGATIVE mg/dL
Urobilinogen, UA: 1 mg/dL (ref 0.0–1.0)

## 2013-02-19 LAB — URINE MICROSCOPIC-ADD ON

## 2013-02-19 LAB — FETAL FIBRONECTIN: Fetal Fibronectin: POSITIVE — AB

## 2013-02-19 MED ORDER — NIFEDIPINE 10 MG PO CAPS
10.0000 mg | ORAL_CAPSULE | Freq: Four times a day (QID) | ORAL | Status: DC
  Administered 2013-02-19: 10 mg via ORAL
  Filled 2013-02-19: qty 1

## 2013-02-19 MED ORDER — ACETAMINOPHEN 500 MG PO TABS
1000.0000 mg | ORAL_TABLET | Freq: Four times a day (QID) | ORAL | Status: DC | PRN
  Administered 2013-02-19: 1000 mg via ORAL
  Filled 2013-02-19: qty 2

## 2013-02-19 MED ORDER — BETAMETHASONE SOD PHOS & ACET 6 (3-3) MG/ML IJ SUSP
12.0000 mg | Freq: Once | INTRAMUSCULAR | Status: AC
  Administered 2013-02-20: 12 mg via INTRAMUSCULAR
  Filled 2013-02-19: qty 2

## 2013-02-19 NOTE — MAU Provider Note (Signed)
History     CSN: [id number]  Arrival date and time: 02/19/13 2044   None     Chief Complaint  Patient presents with  . Abdominal Pain   HPI 17 y.o. G3P1011 at [redacted]w[redacted]d with lower abdominal and back cramping since 7 pm (3 hours). Happened while eating. No nausea, vomiting, diarrhea, or constipation. Mucous discharge starting earlier today. No bleeding or loss of fluid. No recent intercourse.   [facility]ce[facility] for short cervix. Already received betamethasone about 6 weeks ago. Has appt July 3.   OB History   Grav Para Term Preterm Abortions TAB SAB Ect Mult Living   3 1 1  0 1 0 1 0 0 1      Past Medical History  Diagnosis Date  . Enlarged thyroid   . Medical history non-contributory     Past Surgical History  Procedure Laterality Date  . Wisdom tooth extraction      Family History  Problem Relation Age of Onset  . Cancer Mother     kidney and gallbladder  . Cancer Maternal Aunt     ovarian  . Cancer Maternal Grandmother     ovarian    History  Substance Use Topics  . Smoking status: Never Smoker   . Smokeless tobacco: Never Used  . Alcohol Use: No    Allergies: No Known Allergies  Prescriptions prior to admission  Medication Sig Dispense Refill  . Prenatal Vit-Fe Fumarate-FA (PRENATAL MULTIVITAMIN) TABS Take 1 tablet by mouth at bedtime.        ROS See HPI  Physical Exam   Blood pressure 119/67, pulse 85, temperature 98.1 F (36.7 C), temperature source Oral, resp. rate 18, height 6' (1.829 m), weight 97.705 kg (215 lb 6.4 oz), last menstrual period 08/16/2012.  Physical Exam GEN:  WNWD, no distress HEENT:  NCAT, EOMI, conjunctiva clear NECK:  Supple, non-tender, no thyromegaly, trachea midline CV: RRR, no murmur RESP:  CTAB ABD:  Soft, non-tender, no guarding or rebound, normal bowel sounds EXTREM:  Warm, well perfused, no edema or tenderness NEURO:  Alert, oriented, no focal deficits GU:  Normal external genitalia, normal  vagina, no pooling or bleeding. No significant discharge.   Dilation: Closed Effacement (%):  50% Cervical Position: Posterior Exam by:: Dr Thad Ranger  Results for orders placed during the hospital encounter of 02/19/13 (from the past 24 hour(s))  URINALYSIS, ROUTINE W REFLEX MICROSCOPIC     Status: Abnormal   Collection Time    02/19/13  9:15 PM      Result Value Range   Color, Urine YELLOW  YELLOW   APPearance CLEAR  CLEAR   Specific Gravity, Urine 1.015  1.005 - 1.030   pH 7.0  5.0 - 8.0   Glucose, UA NEGATIVE  NEGATIVE mg/dL   Hgb urine dipstick NEGATIVE  NEGATIVE   Bilirubin Urine NEGATIVE  NEGATIVE   Ketones, ur NEGATIVE  NEGATIVE mg/dL   Protein, ur NEGATIVE  NEGATIVE mg/dL   Urobilinogen, UA 1.0  0.0 - 1.0 mg/dL   Nitrite NEGATIVE  NEGATIVE   Leukocytes, UA MODERATE (*) NEGATIVE  URINE MICROSCOPIC-ADD ON     Status: Abnormal   Collection Time    02/19/13  9:15 PM      Result Value Range   Squamous Epithelial / LPF FEW (*) RARE   WBC, UA 7-10  <3 WBC/hpf   RBC / HPF 0-2  <3 RBC/hpf   Bacteria, UA FEW (*) RARE  WET PREP, GENITAL  Status: Abnormal   Collection Time    02/19/13 10:05 PM      Result Value Range   Yeast Wet Prep HPF POC NONE SEEN  NONE SEEN   Trich, Wet Prep NONE SEEN  NONE SEEN   Clue Cells Wet Prep HPF POC NONE SEEN  NONE SEEN   WBC, Wet Prep HPF POC FEW (*) NONE SEEN  FETAL FIBRONECTIN     Status: Abnormal   Collection Time    02/19/13 10:52 PM      Result Value Range   Fetal Fibronectin POSITIVE (*) NEGATIVE   ULTRASOUND: By verbal report:  Cervix closed, 1 cm in length.   MAU Course  Procedures  FHTs: 140, mod var, accels present, no decels TOCO: irritability  Procardia 10 mg given in MAU - symptoms resolved  Assessment and Plan  17 y.o. G3P1011 at [redacted]w[redacted]d with abdominal/back pain - FHTs reactive - Known short cervix (1.2 cm at 24 weeks, now 1 cm) - Possible UTI - will treat with Macrobid - Betamethasone given (repeat course since last  course 6 weeks ago) - Home with Procardia xl 30 mg BID, pelvic rest, restricted activity, and close follow up. - Vaginal progesterone - could not afford original prescription, will send for generic progesterone tablet/capsule.  Napoleon Form 02/19/2013, 9:56 PM

## 2013-02-19 NOTE — MAU Note (Signed)
Pt reports having abd cramping on and off since 7:30 pm C/O vaginal discharge that is clear and mucusy and reports fetal movement less than usual.

## 2013-02-20 ENCOUNTER — Inpatient Hospital Stay (HOSPITAL_COMMUNITY)
Admission: AD | Admit: 2013-02-20 | Discharge: 2013-02-20 | Disposition: A | Discharge status: Home / Self Care | Payer: Medicaid Other | Source: Ambulatory Visit | Attending: Obstetrics & Gynecology | Admitting: Obstetrics & Gynecology

## 2013-02-20 DIAGNOSIS — O479 False labor, unspecified: Secondary | ICD-10-CM

## 2013-02-20 LAB — GC/CHLAMYDIA PROBE AMP: GC Probe RNA: NEGATIVE

## 2013-02-20 MED ORDER — NIFEDIPINE ER OSMOTIC RELEASE 30 MG PO TB24: 30.0000 mg | ORAL_TABLET | Freq: Two times a day (BID) | ORAL | Status: DC

## 2013-02-20 MED ORDER — BETAMETHASONE SOD PHOS & ACET 6 (3-3) MG/ML IJ SUSP
12.0000 mg | Freq: Once | INTRAMUSCULAR | Status: AC
  Administered 2013-02-20: 12 mg via INTRAMUSCULAR
  Filled 2013-02-20: qty 2

## 2013-02-20 MED ORDER — PROGESTERONE 100 MG VA SUPP: 1.0000 | Freq: Every day | VAGINAL | Status: DC

## 2013-02-20 MED ORDER — NITROFURANTOIN MONOHYD MACRO 100 MG PO CAPS: 100.0000 mg | ORAL_CAPSULE | Freq: Two times a day (BID) | ORAL | Status: DC

## 2013-02-20 NOTE — MAU Note (Signed)
Pt here for repeat BMX injection, states she feels a lot better today. Denies pain, denies bleeding. Reports good fetal movement.

## 2013-02-21 LAB — URINE CULTURE: Colony Count: NO GROWTH

## 2013-02-23 NOTE — MAU Provider Note (Signed)
Attestation of Attending Supervision of Advanced Practitioner: Evaluation and management procedures were performed by the PA/NP/CNM/OB Fellow under my supervision/collaboration. Chart reviewed and agree with management and plan.  Jolanta Cabeza V 02/23/2013 5:58 PM

## 2013-02-27 ENCOUNTER — Ambulatory Visit (INDEPENDENT_AMBULATORY_CARE_PROVIDER_SITE_OTHER): Payer: Medicaid Other | Admitting: Family Medicine

## 2013-02-27 VITALS — BP 110/72 | Temp 97.6°F | Wt 214.1 lb

## 2013-02-27 DIAGNOSIS — O26873 Cervical shortening, third trimester: Secondary | ICD-10-CM

## 2013-02-27 DIAGNOSIS — O26879 Cervical shortening, unspecified trimester: Secondary | ICD-10-CM

## 2013-02-27 LAB — POCT URINALYSIS DIP (DEVICE)
Bilirubin Urine: NEGATIVE
Hgb urine dipstick: NEGATIVE
Ketones, ur: NEGATIVE mg/dL
Protein, ur: NEGATIVE mg/dL
Specific Gravity, Urine: 1.01 (ref 1.005–1.030)
pH: 7 (ref 5.0–8.0)

## 2013-02-27 NOTE — Patient Instructions (Addendum)
Preterm Labor Preterm labor is when labor starts at less than 37 weeks of pregnancy. The normal length of a pregnancy is 39 to 41 weeks. CAUSES Often, there is no identifiable underlying cause as to why a woman goes into preterm labor. However, one of the most common known causes of preterm labor is infection. Infections of the uterus, cervix, vagina, amniotic sac, bladder, kidney, or even the lungs (pneumonia) can cause labor to start. Other causes of preterm labor include:  Urogenital infections, such as yeast infections and bacterial vaginosis.  Uterine abnormalities (uterine shape, uterine septum, fibroids, bleeding from the placenta).  A cervix that has been operated on and opens prematurely.  Malformations in the baby.  Multiple gestations (twins, triplets, and so on).  Breakage of the amniotic sac. Additional risk factors for preterm labor include:  Previous history of preterm labor.  Premature rupture of membranes (PROM).  A placenta that covers the opening of the cervix (placenta previa).  A placenta that separates from the uterus (placenta abruption).  A cervix that is too weak to hold the baby in the uterus (incompetence cervix).  Having too much fluid in the amniotic sac (polyhydramnios).  Taking illegal drugs or smoking while pregnant.  Not gaining enough weight while pregnant.  Women younger than 66 and older than 19 years old.  Low socioeconomic status.  African-American ethnicity. SYMPTOMS Signs and symptoms of preterm labor include:  Menstrual-like cramps.  Contractions that are 30 to 70 seconds apart, become very regular, closer together, and are more intense and painful.  Contractions that start on the top of the uterus and spread down to the lower abdomen and back.  A sense of increased pelvic pressure or back pain.  A watery or bloody discharge that comes from the vagina. DIAGNOSIS  A diagnosis can be confirmed by:  A vaginal exam.  An  ultrasound of the cervix.  Sampling (swabbing) cervico-vaginal secretions. These samples can be tested for the presence of fetal fibronectin. This is a protein found in cervical discharge which is associated with preterm labor.  Fetal monitoring. TREATMENT  Depending on the length of the pregnancy and other circumstances, a caregiver may suggest bed rest. If necessary, there are medicines that can be given to stop contractions and to quicken fetal lung maturity. If labor happens before 34 weeks of pregnancy, a prolonged hospital stay may be recommended. Treatment depends on the condition of both the mother and baby. PREVENTION There are some things a mother can do to lower the risk of preterm labor in future pregnancies. A woman can:   Stop smoking.  Maintain healthy weight gain and avoid chemicals and drugs that are not necessary.  Be watchful for any type of infection.  Inform her caregiver if she has a known history of preterm labor. Document Released: 11/04/2003 Document Revised: 11/06/2011 Document Reviewed: 12/09/2010 [facility] Patient Information 2014 [place], [place].  Breastfeeding A change in hormones during your pregnancy causes growth of your breast tissue and an increase in number and size of milk ducts. The hormone prolactin allows proteins, sugars, and fats from your blood supply to make breast milk in your milk-producing glands. The hormone progesterone prevents breast milk from being released before the birth of your baby. After the birth of your baby, your progesterone level decreases allowing breast milk to be released. Thoughts of your baby, as well as his or her sucking or crying, can stimulate the release of milk from the milk-producing glands. Deciding to breastfeed (nurse) is one  of the best choices you can make for you and your baby. The information that follows gives a brief review of the benefits, as well as other important skills to know about breastfeeding. BENEFITS  OF BREASTFEEDING For your baby  The first milk (colostrum) helps your baby's digestive system function better.   There are antibodies in your milk that help your baby fight off infections.   Your baby has a lower incidence of asthma, allergies, and sudden infant death syndrome (SIDS).   The nutrients in breast milk are better for your baby than infant formulas.  Breast milk improves your baby's brain development.   Your baby will have less gas, colic, and constipation.  Your baby is less likely to develop other conditions, such as childhood obesity, asthma, or diabetes mellitus. For you  Breastfeeding helps develop a very special bond between you and your baby.   Breastfeeding is convenient, always available at the correct temperature, and costs nothing.   Breastfeeding helps to burn calories and helps you lose the weight gained during pregnancy.   Breastfeeding makes your uterus contract back down to normal size faster and slows bleeding following delivery.   Breastfeeding mothers have a lower risk of developing osteoporosis or breast or ovarian cancer later in life.  BREASTFEEDING FREQUENCY  A healthy, full-term baby may breastfeed as often as every hour or space his or her feedings to every 3 hours. Breastfeeding frequency will vary from baby to baby.   Newborns should be fed no less than every 2 3 hours during the day and every 4 5 hours during the night. You should breastfeed a minimum of 8 feedings in a 24 hour period.  Awaken your baby to breastfeed if it has been 3 4 hours since the last feeding.  Breastfeed when you feel the need to reduce the fullness of your breasts or when your newborn shows signs of hunger. Signs that your baby may be hungry include:  Increased alertness or activity.  Stretching.  Movement of the head from side to side.  Movement of the head and opening of the mouth when the corner of the mouth or cheek is stroked  (rooting).  Increased sucking sounds, smacking lips, cooing, sighing, or squeaking.  Hand-to-mouth movements.  Increased sucking of fingers or hands.  Fussing.  Intermittent crying.  Signs of extreme hunger will require calming and consoling before you try to feed your baby. Signs of extreme hunger may include:  Restlessness.  A loud, strong cry.  Screaming.  Frequent feeding will help you make more milk and will help prevent problems, such as sore nipples and engorgement of the breasts.  BREASTFEEDING   Whether lying down or sitting, be sure that the baby's abdomen is facing your abdomen.   Support your breast with 4 fingers under your breast and your thumb above your nipple. Make sure your fingers are well away from your nipple and your baby's mouth.   Stroke your baby's lips gently with your finger or nipple.   When your baby's mouth is open wide enough, place all of your nipple and as much of the colored area around your nipple (areola) as possible into your baby's mouth.  More areola should be visible above his or her upper lip than below his or her lower lip.  Your baby's tongue should be between his or her lower gum and your breast.  Ensure that your baby's mouth is correctly positioned around the nipple (latched). Your baby's lips should create a  seal on your breast.  Signs that your baby has effectively latched onto your nipple include:  Tugging or sucking without pain.  Swallowing heard between sucks.  Absent click or smacking sound.  Muscle movement above and in front of his or her ears with sucking.  Your baby must suck about 2 3 minutes in order to get your milk. Allow your baby to feed on each breast as long as he or she wants. Nurse your baby until he or she unlatches or falls asleep at the first breast, then offer the second breast.  Signs that your baby is full and satisfied include:  A gradual decrease in the number of sucks or complete  cessation of sucking.  Falling asleep.  Extension or relaxation of his or her body.  Retention of a small amount of milk in his or her mouth.  Letting go of your breast by himself or herself.  Signs of effective breastfeeding in you include:  Breasts that have increased firmness, weight, and size prior to feeding.  Breasts that are softer after nursing.  Increased milk volume, as well as a change in milk consistency and color by the 5th day of breastfeeding.  Breast fullness relieved by breastfeeding.  Nipples are not sore, cracked, or bleeding.  If needed, break the suction by putting your finger into the corner of your baby's mouth and sliding your finger between his or her gums. Then, remove your breast from his or her mouth.  It is common for babies to spit up a small amount after a feeding.  Babies often swallow air during feeding. This can make babies fussy. Burping your baby between breasts can help with this.  Vitamin D supplements are recommended for babies who get only breast milk.  Avoid using a pacifier during your baby's first 4 6 weeks.  Avoid supplemental feedings of water, formula, or juice in place of breastfeeding. Breast milk is all the food your baby needs. It is not necessary for your baby to have water or formula. Your breasts will make more milk if supplemental feedings are avoided during the early weeks. HOW TO TELL WHETHER YOUR BABY IS GETTING ENOUGH BREAST MILK Wondering whether or not your baby is getting enough milk is a common concern among mothers. You can be assured that your baby is getting enough milk if:   Your baby is actively sucking and you hear swallowing.   Your baby seems relaxed and satisfied after a feeding.   Your baby nurses at least 8 12 times in a 24 hour time period.  During the first 6 52 days of age:  Your baby is wetting at least 3 5 diapers in a 24 hour period. The urine should be clear and pale yellow.  Your baby is  having at least 3 4 stools in a 24 hour period. The stool should be soft and yellow.  At 54 52 days of age, your baby is having at least 3 6 stools in a 24 hour period. The stool should be seedy and yellow by 75 days of age.  Your baby has a weight loss less than 7 10% during the first 43 days of age.  Your baby does not lose weight after 29 1 days of age.  Your baby gains 4 7 ounces each week after he or she is 36 days of age.  Your baby gains weight by 23 days of age and is back to birth weight within 2 weeks. ENGORGEMENT In the first  week after your baby is born, you may experience extremely full breasts (engorgement). When engorged, your breasts may feel heavy, warm, or tender to the touch. Engorgement peaks within 24 48 hours after delivery of your baby.  Engorgement may be reduced by:  Continuing to breastfeed.  Increasing the frequency of breastfeeding.  Taking warm showers or applying warm, moist heat to your breasts just before each feeding. This increases circulation and helps the milk flow.   Gently massaging your breast before and during the feedings. With your fingertips, massage from your chest wall towards your nipple in a circular motion.   Ensuring that your baby empties at least one breast at every feeding. It also helps to start the next feeding on the opposite breast.   Expressing breast milk by hand or by using a breast pump to empty the breasts if your baby is sleepy, or not nursing well. You may also want to express milk if you are returning to work oryou feel you are getting engorged.  Ensuring your baby is latched on and positioned properly while breastfeeding. If you follow these suggestions, your engorgement should improve in 24 48 hours. If you are still experiencing difficulty, call your lactation consultant or caregiver.  CARING FOR YOURSELF Take care of your breasts.  Bathe or shower daily.   Avoid using soap on your nipples.   Wear a supportive bra.  Avoid wearing underwire style bras.  Air dry your nipples for a 3 after each feeding.   Use only cotton bra pads to absorb breast milk leakage. Leaking of breast milk between feedings is normal.   Use only pure lanolin on your nipples after nursing. You do not need to wash it off before feeding your baby again. Another option is to express a few drops of breast milk and gently massage that milk into your nipples.  Continue breast self-awareness checks. Take care of yourself.  Eat healthy foods. Alternate 3 meals with 3 snacks.  Avoid foods that you notice affect your baby in a bad way.  Drink milk, fruit juice, and water to satisfy your thirst (about 8 glasses a day).   Rest often, relax, and take your prenatal vitamins to prevent fatigue, stress, and anemia.  Avoid chewing and smoking tobacco.  Avoid alcohol and drug use.  Take over-the-counter and prescribed medicine only as directed by your caregiver or pharmacist. You should always check with your caregiver or pharmacist before taking any new medicine, vitamin, or herbal supplement.  Know that pregnancy is possible while breastfeeding. If desired, talk to your caregiver about family planning and safe birth control methods that may be used while breastfeeding. SEEK MEDICAL CARE IF:   You feel like you want to stop breastfeeding or have become frustrated with breastfeeding.  You have painful breasts or nipples.  Your nipples are cracked or bleeding.  Your breasts are red, tender, or warm.  You have a swollen area on either breast.  You have a fever or chills.  You have nausea or vomiting.  You have drainage from your nipples.  Your breasts do not become full before feedings by the 5th day after delivery.  You feel sad and depressed.  Your baby is too sleepy to eat well.  Your baby is having trouble sleeping.   Your baby is wetting less than 3 diapers in a 24 hour period.  Your baby has less than 3  stools in a 24 hour period.  Your baby's skin or the white  part of his or her eyes becomes more yellow.   Your baby is not gaining weight by 37 days of age. MAKE SURE YOU:   Understand these instructions.  Will watch your condition.  Will get help right away if you are not doing well or get worse. Document Released: 08/14/2005 Document Revised: 05/08/2012 Document Reviewed: 03/20/2012 [facility] Patient Information 2014 [place], [place].

## 2013-02-27 NOTE — Progress Notes (Signed)
Having some contractions.  Braxton-Hicks type.  PTL precautions given.--Needs 28 wk labs.

## 2013-02-27 NOTE — Progress Notes (Signed)
Pulse- 118 Patient reports lower abdominal cramping and pain like mild contractions; patient reports new onset of chest pain that feels like someone is sitting on her chest and reports new onset of bad headaches

## 2013-03-13 ENCOUNTER — Other Ambulatory Visit: Payer: Medicaid Other

## 2013-03-19 ENCOUNTER — Inpatient Hospital Stay (HOSPITAL_COMMUNITY)
Admission: AD | Admit: 2013-03-19 | Discharge: 2013-03-19 | Disposition: A | Discharge status: Home / Self Care | Payer: Medicaid Other | Source: Ambulatory Visit | Attending: Obstetrics & Gynecology | Admitting: Obstetrics & Gynecology

## 2013-03-19 ENCOUNTER — Encounter (HOSPITAL_COMMUNITY): Payer: Self-pay | Admitting: *Deleted

## 2013-03-19 DIAGNOSIS — O479 False labor, unspecified: Secondary | ICD-10-CM

## 2013-03-19 DIAGNOSIS — O4700 False labor before 37 completed weeks of gestation, unspecified trimester: Secondary | ICD-10-CM | POA: Insufficient documentation

## 2013-03-19 DIAGNOSIS — O26879 Cervical shortening, unspecified trimester: Secondary | ICD-10-CM | POA: Insufficient documentation

## 2013-03-19 LAB — URINALYSIS, ROUTINE W REFLEX MICROSCOPIC
Bilirubin Urine: NEGATIVE
Glucose, UA: NEGATIVE mg/dL
Nitrite: NEGATIVE
Specific Gravity, Urine: <1.005 — ABNORMAL LOW (ref 1.005–1.030)
pH: 6 (ref 5.0–8.0)

## 2013-03-19 LAB — URINE MICROSCOPIC-ADD ON

## 2013-03-19 NOTE — Progress Notes (Signed)
Pt states she has not felt a contractions for the last 

## 2013-03-19 NOTE — MAU Provider Note (Signed)
Chief Complaint:  Labor Eval   Cassandra Smith is a 17 y.o.  G3P1011 with IUP at [redacted]w[redacted]d presenting for Labor Eval . Patient states she has been having  Irregular contractions for 2 hours. Initially they were painful and close together but now has only had 2 in the last hour. Always has constant pressure- no change. none vaginal bleeding, intact membranes, with active fetal movement.  NO fevers, chills, constipation, diarrhea, dysuria, hematuria. + headaches.   Seen in [facility] clinic for shortened cervix (last checked 6/27 and was 1cm).  On progesterone PV. Was prescribed procardia but has not been able to get it due to cost.  Has an appt tomorrow.    Menstrual History: OB History   Grav Para Term Preterm Abortions TAB SAB Ect Mult Living   3 1 1  0 1 0 1 0 0 1      Patient's last menstrual period was 08/16/2012.      Past Medical History  Diagnosis Date  . Enlarged thyroid   . Medical history non-contributory     Past Surgical History  Procedure Laterality Date  . Wisdom tooth extraction      Family History  Problem Relation Age of Onset  . Cancer Mother     kidney and gallbladder  . Cancer Maternal Aunt     ovarian  . Cancer Maternal Grandmother     ovarian    History  Substance Use Topics  . Smoking status: Never Smoker   . Smokeless tobacco: Never Used  . Alcohol Use: No     No Known Allergies  Prescriptions prior to admission  Medication Sig Dispense Refill  . NIFEdipine (PROCARDIA XL) 30 MG 24 hr tablet Take 1 tablet (30 mg total) by mouth 2 (two) times daily.  60 tablet  1  . Prenatal Vit-Fe Fumarate-FA (PRENATAL MULTIVITAMIN) TABS Take 1 tablet by mouth at bedtime.      . Progesterone 100 MG SUPP Place 1 suppository (100 mg total) vaginally at bedtime.  30 suppository  3    Review of Systems - Negative except for what is mentioned in HPI.  Physical Exam  Blood pressure 108/74, pulse 87, temperature 99.1 F (37.3 C), temperature source Oral, resp.  rate 20, height 5\' 11"  (1.803 m), weight 99.791 kg (220 lb), last menstrual period 08/16/2012, SpO2 99.00%. GENERAL: Well-developed, well-nourished female in no acute distress.  LUNGS: Clear to auscultation bilaterally.  HEART: Regular rate and rhythm. ABDOMEN: Soft, nontender, nondistended, gravid.  EXTREMITIES: Nontender, no edema, 2+ distal pulses. Cervical Exam: Dilatation 0cm   Effacement 0%   Station -3 FHT:  Baseline rate 130s bpm   Variability moderate  Accelerations present   Decelerations none Contractions: irregular- 2 throughout her stay here    Labs: Results for orders placed during the hospital encounter of 03/19/13 (from the past 24 hour(s))  URINALYSIS, ROUTINE W REFLEX MICROSCOPIC   Collection Time    03/19/13  6:29 PM      Result Value Range   Color, Urine YELLOW  YELLOW   APPearance CLEAR  CLEAR   Specific Gravity, Urine <1.005 (*) 1.005 - 1.030   pH 6.0  5.0 - 8.0   Glucose, UA NEGATIVE  NEGATIVE mg/dL   Hgb urine dipstick TRACE (*) NEGATIVE   Bilirubin Urine NEGATIVE  NEGATIVE   Ketones, ur NEGATIVE  NEGATIVE mg/dL   Protein, ur NEGATIVE  NEGATIVE mg/dL   Urobilinogen, UA 0.2  0.0 - 1.0 mg/dL   Nitrite NEGATIVE  NEGATIVE   Leukocytes, UA LARGE (*) NEGATIVE  URINE MICROSCOPIC-ADD ON   Collection Time    03/19/13  6:29 PM      Result Value Range   Squamous Epithelial / LPF FEW (*) RARE   WBC, UA 3-6  <3 WBC/hpf    Imaging Studies:  US Ob Limited  02/20/2013   OBSTETRICAL ULTRASOUND: This exam was performed within a [place] [facility]. The OB US report was generated in the AS system, and faxed to the ordering physician.   This report is also available in [employer] and in the [employer]. See AS Obstetric US report.  US Ob Transvaginal  02/20/2013   OBSTETRICAL ULTRASOUND: This exam was performed within a [facility]. The OB US report was generated in the AS system, and faxed to the ordering  physician.   This report is also available in [employer] and in the [employer]. See AS Obstetric US report.   Assessment: Cassandra Smith is  17 y.o. G3P1011 at [redacted]w[redacted]d presents with Labor Eval   Plan: 1) labor check SVE 0/0/-3 and unchanged from 02/19/13 Minimal contractions and irregular on TOCO - likely just pre-labor - labor precautions discussed and encouraged - given gestational age and pt's inability to afford procardia, ok to not fill it at this time as no indication this prevents PTL.   2) cervical shortening - cont prometrium  3) FWB - cat I tracing  F/u tomorrow in clinic as scheduled   Yair Dusza, Redmond Baseman MD 7/23/20147:14 PM

## 2013-03-19 NOTE — Progress Notes (Signed)
Pt states she has had a headache since contractions

## 2013-03-19 NOTE — MAU Note (Signed)
Pt states she started feeling discomfort about 1 hour ago

## 2013-03-19 NOTE — Progress Notes (Signed)
No pain since she was in triage

## 2013-03-19 NOTE — MAU Note (Signed)
Patient states she is having contractions and lower abdominal pressure every 2-3 minutes. Having a lot of discharge, no leaking or bleeding. Reports good fetal movement, has been less movement since the contractions started.

## 2013-03-20 ENCOUNTER — Encounter: Payer: Medicaid Other | Admitting: Family

## 2013-04-14 ENCOUNTER — Encounter (HOSPITAL_COMMUNITY): Payer: Self-pay

## 2013-04-14 ENCOUNTER — Inpatient Hospital Stay (HOSPITAL_COMMUNITY)
Admission: AD | Admit: 2013-04-14 | Discharge: 2013-04-14 | Disposition: A | Discharge status: Home / Self Care | Payer: Medicaid Other | Source: Ambulatory Visit | Attending: Obstetrics and Gynecology | Admitting: Obstetrics and Gynecology

## 2013-04-14 DIAGNOSIS — O479 False labor, unspecified: Secondary | ICD-10-CM | POA: Insufficient documentation

## 2013-04-14 DIAGNOSIS — O9989 Other specified diseases and conditions complicating pregnancy, childbirth and the puerperium: Secondary | ICD-10-CM

## 2013-04-14 DIAGNOSIS — N949 Unspecified condition associated with female genital organs and menstrual cycle: Secondary | ICD-10-CM | POA: Insufficient documentation

## 2013-04-14 DIAGNOSIS — N898 Other specified noninflammatory disorders of vagina: Secondary | ICD-10-CM

## 2013-04-14 HISTORY — DX: Renal tubulo-interstitial disease, unspecified: N15.9

## 2013-04-14 LAB — WET PREP, GENITAL
Trich, Wet Prep: NONE SEEN
Yeast Wet Prep HPF POC: NONE SEEN

## 2013-04-14 NOTE — MAU Note (Signed)
Patient states she has been having irregular contractions. States when she woke up about 1300 and got up she had leaking of clear fluid that filled a pad. Not actively leaking at this time. Reports good fetal movement, no bleeding.

## 2013-04-14 NOTE — MAU Provider Note (Signed)
Chief Complaint:  Labor Eval and Vaginal Discharge   Cassandra Smith is a 17 y.o.  G3P1011 with IUP at [redacted]w[redacted]d presenting for Labor Eval and Vaginal Discharge .   Patient states she has been having  Irregular every 3-10 min but not painful contractions, no vaginal bleeding, with active fetal movement.  States is afraid her water broke around 1 pm today. Woke up from a nap and had some clear discharge on her underwear. Put a pad on and filled it in the next hour. No significant leakage since then.   Pt receives her care in low risk clinic but has not been seen in >24month. States she just missed her appt and never rescheduled. Never got her 28 week labs either. Had a normal [place].    Menstrual History: OB History   Grav Para Term Preterm Abortions TAB SAB Ect Mult Living   3 1 1  0 1 0 1 0 0 1       Patient's last menstrual period was 08/16/2012.      Past Medical History  Diagnosis Date  . Enlarged thyroid   . Medical history non-contributory   . Kidney infection     before first pregnancy    Past Surgical History  Procedure Laterality Date  . Wisdom tooth extraction      Family History  Problem Relation Age of Onset  . Cancer Mother     kidney and gallbladder  . Cancer Maternal Aunt     ovarian  . Cancer Maternal Grandmother     ovarian    History  Substance Use Topics  . Smoking status: Never Smoker   . Smokeless tobacco: Never Used  . Alcohol Use: No     No Known Allergies  Prescriptions prior to admission  Medication Sig Dispense Refill  . Prenatal Vit-Fe Fumarate-FA (PRENATAL MULTIVITAMIN) TABS Take 1 tablet by mouth at bedtime.        Review of Systems - Negative except for what is mentioned in HPI.  Physical Exam  Blood pressure 131/83, pulse 100, temperature 98.5 F (36.9 C), temperature source Oral, resp. rate 20, height 5' 10.5" (1.791 m), weight 100.426 kg (221 lb 6.4 oz), last menstrual period 08/16/2012, SpO2 99.00%. GENERAL: Well-developed,  well-nourished female in no acute distress.  LUNGS: Clear to auscultation bilaterally.  HEART: Regular rate and rhythm. ABDOMEN: Soft, nontender, nondistended, gravid.  EXTREMITIES: Nontender, no edema, 2+ distal pulses. SSE: no pooling. Thick discharge- yellow in color. No cmt.  Cervical Exam: Dilatation 2.5cm   Effacement 40%   Station -3  Presentation: cephalic FHT:  Baseline rate 140 bpm   Variability moderate  Accelerations present   Decelerations none Contractions: none   Labs: No results found for this or any previous visit (from the past 24 hour(s)).  Imaging Studies:  No results found.  Assessment: Cassandra Smith is  17 y.o. G3P1011 at [redacted]w[redacted]d presents with Labor Eval and Vaginal Discharge .  Plan:  Pt presents for r/o rupture.  - sterile spec neg for pooling or ferning - sve 2.5 and felt like head without a significant amount of fluid or sac being palpated. Thus, amnisure was sent and negative - wet prep sent.  - gbs was also collected   - reassurance given.  And return precautions discussed. FWB cat I.   Pt also encouraged to see a physician and importance of prenatal care discussed. Her sister who was with her agreed as well and will work with to get her an  appt later this week.   Chavy Avera L 8/18/20144:19 PM

## 2013-04-15 NOTE — MAU Provider Note (Signed)
Attestation of Attending Supervision of Advanced Practitioner (CNM/NP): Evaluation and management procedures were performed by the Advanced Practitioner under my supervision and collaboration.  I have reviewed the Advanced Practitioner's note and chart, and I agree with the management and plan.  Damon Baisch 04/15/2013 6:49 AM

## 2013-04-17 ENCOUNTER — Encounter: Payer: Self-pay | Admitting: Family Medicine

## 2013-04-17 ENCOUNTER — Encounter: Payer: Medicaid Other | Admitting: Obstetrics & Gynecology

## 2013-04-17 LAB — CULTURE, BETA STREP (GROUP B ONLY)

## 2013-04-25 ENCOUNTER — Inpatient Hospital Stay (HOSPITAL_COMMUNITY): Payer: Medicaid Other | Admitting: Anesthesiology

## 2013-04-25 ENCOUNTER — Inpatient Hospital Stay (HOSPITAL_COMMUNITY)
Admission: AD | Admit: 2013-04-25 | Discharge: 2013-04-27 | DRG: 775 | Disposition: A | Discharge status: Home / Self Care | Payer: Medicaid Other | Source: Ambulatory Visit | Attending: Obstetrics & Gynecology | Admitting: Obstetrics & Gynecology

## 2013-04-25 ENCOUNTER — Encounter (HOSPITAL_COMMUNITY): Payer: Self-pay | Admitting: *Deleted

## 2013-04-25 ENCOUNTER — Encounter (HOSPITAL_COMMUNITY): Payer: Self-pay | Admitting: Anesthesiology

## 2013-04-25 DIAGNOSIS — IMO0001 Reserved for inherently not codable concepts without codable children: Secondary | ICD-10-CM

## 2013-04-25 DIAGNOSIS — O0930 Supervision of pregnancy with insufficient antenatal care, unspecified trimester: Secondary | ICD-10-CM

## 2013-04-25 DIAGNOSIS — O26879 Cervical shortening, unspecified trimester: Secondary | ICD-10-CM

## 2013-04-25 LAB — CBC
Hemoglobin: 10.3 g/dL — ABNORMAL LOW (ref 12.0–15.0)
MCH: 30.2 pg (ref 26.0–34.0)
RBC: 3.41 MIL/uL — ABNORMAL LOW (ref 3.87–5.11)

## 2013-04-25 LAB — RPR: RPR Ser Ql: NONREACTIVE

## 2013-04-25 LAB — OB RESULTS CONSOLE HIV ANTIBODY (ROUTINE TESTING): HIV: NONREACTIVE

## 2013-04-25 MED ORDER — DIBUCAINE 1 % RE OINT: 1.0000 "application " | TOPICAL_OINTMENT | RECTAL | Status: DC | PRN

## 2013-04-25 MED ORDER — PHENYLEPHRINE 40 MCG/ML (10ML) SYRINGE FOR IV PUSH (FOR BLOOD PRESSURE SUPPORT)
80.0000 ug | PREFILLED_SYRINGE | INTRAVENOUS | Status: DC | PRN
  Filled 2013-04-25: qty 2
  Filled 2013-04-25: qty 5

## 2013-04-25 MED ORDER — PHENYLEPHRINE 40 MCG/ML (10ML) SYRINGE FOR IV PUSH (FOR BLOOD PRESSURE SUPPORT): 80.0000 ug | PREFILLED_SYRINGE | INTRAVENOUS | Status: DC | PRN

## 2013-04-25 MED ORDER — LACTATED RINGERS IV SOLN
INTRAVENOUS | Status: DC
  Administered 2013-04-25 (×2): via INTRAVENOUS

## 2013-04-25 MED ORDER — PRENATAL MULTIVITAMIN CH
1.0000 | ORAL_TABLET | Freq: Every day | ORAL | Status: DC
  Administered 2013-04-26: 1 via ORAL
  Filled 2013-04-25: qty 1

## 2013-04-25 MED ORDER — BENZOCAINE-MENTHOL 20-0.5 % EX AERO
1.0000 "application " | INHALATION_SPRAY | CUTANEOUS | Status: DC | PRN
  Administered 2013-04-25: 1 via TOPICAL
  Filled 2013-04-25: qty 56

## 2013-04-25 MED ORDER — FLEET ENEMA 7-19 GM/118ML RE ENEM: 1.0000 | ENEMA | RECTAL | Status: DC | PRN

## 2013-04-25 MED ORDER — EPHEDRINE 5 MG/ML INJ: 10.0000 mg | INTRAVENOUS | Status: DC | PRN

## 2013-04-25 MED ORDER — ZOLPIDEM TARTRATE 5 MG PO TABS: 5.0000 mg | ORAL_TABLET | Freq: Every evening | ORAL | Status: DC | PRN

## 2013-04-25 MED ORDER — OXYCODONE-ACETAMINOPHEN 5-325 MG PO TABS: 1.0000 | ORAL_TABLET | ORAL | Status: DC | PRN

## 2013-04-25 MED ORDER — ONDANSETRON HCL 4 MG PO TABS: 4.0000 mg | ORAL_TABLET | ORAL | Status: DC | PRN

## 2013-04-25 MED ORDER — ONDANSETRON HCL 4 MG/2ML IJ SOLN: 4.0000 mg | INTRAMUSCULAR | Status: DC | PRN

## 2013-04-25 MED ORDER — LIDOCAINE HCL (PF) 1 % IJ SOLN
30.0000 mL | INTRAMUSCULAR | Status: DC | PRN
  Filled 2013-04-25: qty 30

## 2013-04-25 MED ORDER — FENTANYL CITRATE 0.05 MG/ML IJ SOLN: 50.0000 ug | INTRAMUSCULAR | Status: DC | PRN

## 2013-04-25 MED ORDER — OXYCODONE-ACETAMINOPHEN 5-325 MG PO TABS
1.0000 | ORAL_TABLET | ORAL | Status: DC | PRN
  Administered 2013-04-26 (×3): 1 via ORAL
  Filled 2013-04-25 (×4): qty 1

## 2013-04-25 MED ORDER — EPHEDRINE 5 MG/ML INJ
10.0000 mg | INTRAVENOUS | Status: DC | PRN
  Administered 2013-04-25: 10 mg via INTRAVENOUS
  Filled 2013-04-25: qty 4
  Filled 2013-04-25: qty 2

## 2013-04-25 MED ORDER — LANOLIN HYDROUS EX OINT: TOPICAL_OINTMENT | CUTANEOUS | Status: DC | PRN

## 2013-04-25 MED ORDER — IBUPROFEN 600 MG PO TABS: 600.0000 mg | ORAL_TABLET | Freq: Four times a day (QID) | ORAL | Status: DC | PRN

## 2013-04-25 MED ORDER — TETANUS-DIPHTH-ACELL PERTUSSIS 5-2.5-18.5 LF-MCG/0.5 IM SUSP: 0.5000 mL | Freq: Once | INTRAMUSCULAR | Status: DC

## 2013-04-25 MED ORDER — FENTANYL 2.5 MCG/ML BUPIVACAINE 1/10 % EPIDURAL INFUSION (WH - ANES)
14.0000 mL/h | INTRAMUSCULAR | Status: DC | PRN
  Administered 2013-04-25 (×2): 14 mL/h via EPIDURAL
  Filled 2013-04-25 (×2): qty 125

## 2013-04-25 MED ORDER — SIMETHICONE 80 MG PO CHEW: 80.0000 mg | CHEWABLE_TABLET | ORAL | Status: DC | PRN

## 2013-04-25 MED ORDER — ONDANSETRON HCL 4 MG/2ML IJ SOLN: 4.0000 mg | Freq: Four times a day (QID) | INTRAMUSCULAR | Status: DC | PRN

## 2013-04-25 MED ORDER — DIPHENHYDRAMINE HCL 50 MG/ML IJ SOLN
12.5000 mg | INTRAMUSCULAR | Status: DC | PRN
  Administered 2013-04-25 (×2): 12.5 mg via INTRAVENOUS
  Filled 2013-04-25 (×2): qty 1

## 2013-04-25 MED ORDER — FENTANYL 2.5 MCG/ML BUPIVACAINE 1/10 % EPIDURAL INFUSION (WH - ANES): 14.0000 mL/h | INTRAMUSCULAR | Status: DC | PRN

## 2013-04-25 MED ORDER — IBUPROFEN 600 MG PO TABS
600.0000 mg | ORAL_TABLET | Freq: Four times a day (QID) | ORAL | Status: DC
  Administered 2013-04-25 – 2013-04-27 (×6): 600 mg via ORAL
  Filled 2013-04-25 (×6): qty 1

## 2013-04-25 MED ORDER — OXYTOCIN 40 UNITS IN LACTATED RINGERS INFUSION - SIMPLE MED: 62.5000 mL/h | INTRAVENOUS | Status: DC

## 2013-04-25 MED ORDER — LACTATED RINGERS IV SOLN: 500.0000 mL | Freq: Once | INTRAVENOUS | Status: DC

## 2013-04-25 MED ORDER — LIDOCAINE HCL (PF) 1 % IJ SOLN
INTRAMUSCULAR | Status: DC | PRN
  Administered 2013-04-25 (×4): 4 mL

## 2013-04-25 MED ORDER — WITCH HAZEL-GLYCERIN EX PADS: 1.0000 "application " | MEDICATED_PAD | CUTANEOUS | Status: DC | PRN

## 2013-04-25 MED ORDER — TERBUTALINE SULFATE 1 MG/ML IJ SOLN: 0.2500 mg | Freq: Once | INTRAMUSCULAR | Status: DC | PRN

## 2013-04-25 MED ORDER — ACETAMINOPHEN 325 MG PO TABS: 650.0000 mg | ORAL_TABLET | ORAL | Status: DC | PRN

## 2013-04-25 MED ORDER — MISOPROSTOL 200 MCG PO TABS: 800.0000 ug | ORAL_TABLET | Freq: Once | ORAL | Status: DC

## 2013-04-25 MED ORDER — MISOPROSTOL 200 MCG PO TABS
ORAL_TABLET | ORAL | Status: AC
  Administered 2013-04-25: 800 ug
  Filled 2013-04-25: qty 4

## 2013-04-25 MED ORDER — EPHEDRINE 5 MG/ML INJ
10.0000 mg | INTRAVENOUS | Status: DC | PRN
  Filled 2013-04-25 (×2): qty 4
  Filled 2013-04-25: qty 2

## 2013-04-25 MED ORDER — CITRIC ACID-SODIUM CITRATE 334-500 MG/5ML PO SOLN: 30.0000 mL | ORAL | Status: DC | PRN

## 2013-04-25 MED ORDER — OXYTOCIN 40 UNITS IN LACTATED RINGERS INFUSION - SIMPLE MED
1.0000 m[IU]/min | INTRAVENOUS | Status: DC
  Administered 2013-04-25: 2 m[IU]/min via INTRAVENOUS
  Filled 2013-04-25: qty 1000

## 2013-04-25 MED ORDER — DIPHENHYDRAMINE HCL 25 MG PO CAPS: 25.0000 mg | ORAL_CAPSULE | Freq: Four times a day (QID) | ORAL | Status: DC | PRN

## 2013-04-25 MED ORDER — OXYTOCIN BOLUS FROM INFUSION: 500.0000 mL | INTRAVENOUS | Status: DC

## 2013-04-25 MED ORDER — SENNOSIDES-DOCUSATE SODIUM 8.6-50 MG PO TABS
2.0000 | ORAL_TABLET | Freq: Every day | ORAL | Status: DC
  Administered 2013-04-26: 2 via ORAL

## 2013-04-25 MED ORDER — DIPHENHYDRAMINE HCL 50 MG/ML IJ SOLN: 12.5000 mg | INTRAMUSCULAR | Status: DC | PRN

## 2013-04-25 MED ORDER — LACTATED RINGERS IV SOLN
500.0000 mL | INTRAVENOUS | Status: DC | PRN
  Administered 2013-04-25: 500 mL via INTRAVENOUS

## 2013-04-25 NOTE — Progress Notes (Signed)
Cassandra Smith is a 17 y.o. G3P1011 at [redacted]w[redacted]d admitted for active labor  Subjective:  Pt is comfortable with epidural. +FM. No LOF  Objective: BP 93/46  Pulse 59  Temp(Src) 97.7 F (36.5 C) (Oral)  Resp 18  Ht 5\' 10"  (1.778 m)  Wt 99.111 kg (218 lb 8 oz)  BMI 31.35 kg/m2  SpO2 98%  LMP 08/16/2012 I/O last 3 completed shifts: In: -  Out: 550 [Urine:550]    FHT:  FHR: 110 bpm, variability: moderate,  accelerations:  Present,  decelerations:  Absent UC:   irregular, every 5-8 minutes SVE:   Dilation: 7 Effacement (%): 80 Station: -1 Exam by:: Dr. Reola Calkins  Labs: Lab Results  Component Value Date   WBC 11.3* 04/25/2013   HGB 10.3* 04/25/2013   HCT 30.7* 04/25/2013   MCV 90.0 04/25/2013   PLT 245 04/25/2013    Assessment / Plan: SOL now with poor contraction pattern  Labor: AROM performed with return of scant amount of moderate mec stained fluid. Will plan to start pit  Fetal Wellbeing:  Category I Pain Control:  Epidural I/D:  n/a Anticipated MOD:  NSVD  Chinonso Linker L 04/25/2013, 10:03 AM

## 2013-04-25 NOTE — MAU Note (Signed)
PT SAYS SHE HAS NOT BEEN TO PNV SINCE 34 WEEKS - HAD FAMILY DEATH.     HAS BEEN HURTING TONIGHT SINCE 1030PM.     WAS IN MAU 2 WEEKS AGO-- VE 2-3 CM.      DENIES HSV AND MRSA.

## 2013-04-25 NOTE — H&P (Signed)
Cassandra Smith is a 17 y.o. female G3P1011 with IUP at [redacted]w[redacted]d presenting for c/o pressure. Pt states she has been having irregular, every 3-6 minutes contractions, associated with no vaginal bleeding.  Membranes are intact, with active fetal movement.   [facility] at [facility] 24 wks with 2 visits  Prenatal History/Complications: Limited PNC; had a short cervix of 1.5cm, did not use vaginal progesterone Past Medical History: Past Medical History  Diagnosis Date  . Enlarged thyroid   . Medical history non-contributory   . Kidney infection     before first pregnancy    Past Surgical History: Past Surgical History  Procedure Laterality Date  . Wisdom tooth extraction      Obstetrical History: OB History   Grav Para Term Preterm Abortions TAB SAB Ect Mult Living   3 1 1  0 1 0 1 0 0 1        Social History: History   Social History  . Marital Status: Single    Spouse Name: N/A    Number of Children: N/A  . Years of Education: N/A   Social History Main Topics  . Smoking status: Never Smoker   . Smokeless tobacco: Never Used  . Alcohol Use: No  . Drug Use: No  . Sexual Activity: Yes   Other Topics Concern  . None   Social History Narrative  . None    Family History: Family History  Problem Relation Age of Onset  . Cancer Mother     kidney and gallbladder  . Cancer Maternal Aunt     ovarian  . Cancer Maternal Grandmother     ovarian    Allergies: No Known Allergies  Prescriptions prior to admission  Medication Sig Dispense Refill  . Prenatal Vit-Fe Fumarate-FA (PRENATAL MULTIVITAMIN) TABS Take 1 tablet by mouth at bedtime.         Review of Systems   Constitutional: Negative for fever, chills, weight loss, malaise/fatigue and diaphoresis.  HENT: Negative for hearing loss, ear pain, nosebleeds, congestion, sore throat, neck pain, tinnitus and ear discharge.   Eyes: Negative for blurred vision, double vision, photophobia, pain, discharge and redness.   Respiratory: Negative for cough, hemoptysis, sputum production, shortness of breath, wheezing and stridor.   Cardiovascular: Negative for chest pain, palpitations, orthopnea,  leg swelling  Gastrointestinal: Positive for abdominal pain. Negative for heartburn, nausea, vomiting, diarrhea, constipation, blood in stool Genitourinary: Negative for dysuria, urgency, frequency, hematuria and flank pain.  Musculoskeletal: Negative for myalgias, back pain, joint pain and falls.  Skin: Negative for itching and rash.  Neurological: Negative for dizziness, tingling, tremors, sensory change, speech change, focal weakness, seizures, loss of consciousness, weakness and headaches.  Endo/Heme/Allergies: Negative for environmental allergies and polydipsia. Does not bruise/bleed easily.  Psychiatric/Behavioral: Negative for depression, suicidal ideas, hallucinations, memory loss and substance abuse. The patient is not nervous/anxious and does not have insomnia.       Blood pressure 116/70, pulse 100, temperature 98 F (36.7 C), temperature source Oral, height 5\' 10"  (1.778 m), weight 99.111 kg (218 lb 8 oz), last menstrual period 08/16/2012. General appearance: alert, cooperative and no distress Lungs: clear to auscultation bilaterally Heart: regular rate and rhythm Abdomen: soft, non-tender; bowel sounds normal Extremities: Homans sign is negative, no sign of DVT DTR's 2+ Presentation: cephalic, direct OP Fetal monitoringBaseline: 140 bpm, Variability: Good {> 6 bpm), Accelerations: Reactive and Decelerations: Absent Uterine activity  Mild q 2-4  Dilation: 6 Effacement (%): 70 Station: -2 Exam by:: Cassandra Smith, CNM  Prenatal labs: ABO, Rh: --/--/O POS, O POS (05/09 1630) Antibody: NEG (05/09 1630) Rubella:  immune RPR: NON REACTIVE (05/09 1330)  HBsAg: NEGATIVE (05/09 1330)  HIV:   neg GBS: Negative (08/18 0000)  1 hr Glucola not done Genetic screening  Not done Anatomy US normal   Results for  orders placed during the hospital encounter of 04/25/13 (from the past 24 hour(s))  CBC   Collection Time    04/25/13  2:45 AM      Result Value Range   WBC 11.3 (*) 4.0 - 10.5 K/uL   RBC 3.41 (*) 3.87 - 5.11 MIL/uL   Hemoglobin 10.3 (*) 12.0 - 15.0 g/dL   HCT 56.2 (*) 13.0 - 86.5 %   MCV 90.0  78.0 - 100.0 fL   MCH 30.2  26.0 - 34.0 pg   MCHC 33.6  30.0 - 36.0 g/dL   RDW 78.4  69.6 - 29.5 %   Platelets 245  150 - 400 K/uL    Assessment: Cassandra Smith is a 17 y.o. G3P1011 with an IUP at 101w5d presenting for active labor  Plan: Expectant management   Cassandra Smith 04/25/2013, 3:23 AM

## 2013-04-25 NOTE — Anesthesia Procedure Notes (Signed)
Epidural Patient location during procedure: OB Start time: 04/25/2013 4:43 AM  Staffing Performed by: anesthesiologist   Preanesthetic Checklist Completed: patient identified, site marked, surgical consent, pre-op evaluation, timeout performed, IV checked, risks and benefits discussed and monitors and equipment checked  Epidural Patient position: sitting Prep: site prepped and draped and DuraPrep Patient monitoring: continuous pulse ox and blood pressure Approach: midline Injection technique: LOR air  Needle:  Needle type: Tuohy  Needle gauge: 17 G Needle length: 9 cm and 9 Needle insertion depth: 6 cm Catheter type: closed end flexible Catheter size: 19 Gauge Catheter at skin depth: 11 cm Test dose: negative  Assessment Events: blood not aspirated, injection not painful, no injection resistance, negative IV test and no paresthesia  Additional Notes Discussed risk of headache, infection, bleeding, nerve injury and failed or incomplete block.  Patient voices understanding and wishes to proceed.  Epidural placed easily on first attempt.  No paresthesia.  Patient tolerated procedure well with no apparent complications.  Jasmine December, MDReason for block:procedure for pain

## 2013-04-25 NOTE — Anesthesia Preprocedure Evaluation (Signed)
Anesthesia Evaluation  Patient identified by MRN, date of birth, ID band Patient awake    Reviewed: Allergy & Precautions, H&P , NPO status , Patient's Chart, lab work & pertinent test results, reviewed documented beta blocker date and time   History of Anesthesia Complications Negative for: history of anesthetic complications  Airway Mallampati: I TM Distance: >3 FB Neck ROM: full    Dental  (+) Teeth Intact   Pulmonary neg pulmonary ROS,  breath sounds clear to auscultation        Cardiovascular negative cardio ROS  Rhythm:regular Rate:Normal     Neuro/Psych negative neurological ROS  negative psych ROS   GI/Hepatic negative GI ROS, Neg liver ROS,   Endo/Other  negative endocrine ROS  Renal/GU negative Renal ROS     Musculoskeletal   Abdominal   Peds  Hematology  (+) anemia ,   Anesthesia Other Findings   Reproductive/Obstetrics (+) Pregnancy                           Anesthesia Physical Anesthesia Plan  ASA: II  Anesthesia Plan: Epidural   Post-op Pain Management:    Induction:   Airway Management Planned:   Additional Equipment:   Intra-op Plan:   Post-operative Plan:   Informed Consent: I have reviewed the patients History and Physical, chart, labs and discussed the procedure including the risks, benefits and alternatives for the proposed anesthesia with the patient or authorized representative who has indicated his/her understanding and acceptance.     Plan Discussed with:   Anesthesia Plan Comments:         Anesthesia Quick Evaluation  

## 2013-04-25 NOTE — H&P (Signed)
Attestation of Attending Supervision of Advanced Practitioner (PA/CNM/NP): Evaluation and management procedures were performed by the Advanced Practitioner under my supervision and collaboration.  I have reviewed the Advanced Practitioner's note and chart, and I agree with the management and plan.  Hakop Humbarger, MD, FACOG Attending Obstetrician & Gynecologist Faculty Practice, [facility]  

## 2013-04-25 NOTE — Progress Notes (Signed)
Patient ID: SHAMIKIA LINSKEY, female   DOB: [birth year], 17 y.o.   MRN: [id number] SKYLARR LIZ is a 17 y.o. G3P1011 at [redacted]w[redacted]d admitted for active labor  Subjective:  Pt is comfortable with epidural. +FM. No LOF  Objective: BP 117/73  Pulse 82  Temp(Src) 97.5 F (36.4 C) (Oral)  Resp 18  Ht 5\' 10"  (1.778 m)  Wt 99.111 kg (218 lb 8 oz)  BMI 31.35 kg/m2  SpO2 98%  LMP 08/16/2012 I/O last 3 completed shifts: In: -  Out: 550 [Urine:550]    FHT:  FHR: 120 bpm, variability: moderate,  accelerations:  Present,  decelerations:  Absent UC:   irregular, every 3-5 minutes SVE:   Dilation: 9 Effacement (%): 90 Station: 0 Exam by:: Dr. Reola Calkins  Labs: Lab Results  Component Value Date   WBC 11.3* 04/25/2013   HGB 10.3* 04/25/2013   HCT 30.7* 04/25/2013   MCV 90.0 04/25/2013   PLT 245 04/25/2013    Assessment / Plan: SOL now with poor contraction pattern  Labor: correction from last note: AROM performed with return of scant amount of clear fluid. cont to titrate pit  Fetal Wellbeing:  Category I Pain Control:  Epidural I/D:  n/a Anticipated MOD:  NSVD  Devery Odwyer L 04/25/2013, 11:56 AM

## 2013-04-25 NOTE — Progress Notes (Signed)
   Cassandra Smith is a 17 y.o. G3P1011 at [redacted]w[redacted]d  admitted for active labor  Subjective: Getting epidural  Objective: BP 126/78  Pulse 76  Temp(Src) 98.4 F (36.9 C) (Oral)  Resp 18  Ht 5\' 10"  (1.778 m)  Wt 99.111 kg (218 lb 8 oz)  BMI 31.35 kg/m2  SpO2 98%  LMP 08/16/2012    FHT:  FHR: 150 bpm, variability: moderate,  accelerations:  Present,  decelerations:  Absent UC:   irregular, every 2-4  minutes SVE:   Dilation: 6 Effacement (%): 70 Station: -2 Exam by:: FRAN, CNM   Labs: Lab Results  Component Value Date   WBC 11.3* 04/25/2013   HGB 10.3* 04/25/2013   HCT 30.7* 04/25/2013   MCV 90.0 04/25/2013   PLT 245 04/25/2013    Assessment / Plan: Spontaneous labor, progressing normally  Labor: Progressing normally Fetal Wellbeing:  Category I Pain Control:  Epidural Anticipated MOD:  NSVD  CRESENZO-DISHMAN,Aarsh Fristoe 04/25/2013, 4:55 AM

## 2013-04-26 LAB — CBC
HCT: 27.7 % — ABNORMAL LOW (ref 36.0–46.0)
MCHC: 33.2 g/dL (ref 30.0–36.0)
MCV: 91.4 fL (ref 78.0–100.0)
Platelets: 212 10*3/uL (ref 150–400)
RDW: 13.1 % (ref 11.5–15.5)
WBC: 12.2 10*3/uL — ABNORMAL HIGH (ref 4.0–10.5)

## 2013-04-26 NOTE — Progress Notes (Addendum)
Post Partum Day 1 Subjective: no complaints, up ad lib, voiding, tolerating PO and + flatus  Objective: Blood pressure 119/71, pulse 61, temperature 97.6 F (36.4 C), temperature source Oral, resp. rate 18, height 5\' 10"  (1.778 m), weight 99.111 kg (218 lb 8 oz), last menstrual period 08/16/2012, SpO2 97.00%, unknown if currently breastfeeding.  Physical Exam:  General: alert, cooperative and no distress Lochia: appropriate Uterine Fundus: firm Incision: n/a DVT Evaluation: No evidence of DVT seen on physical exam. Negative Homan's sign. No cords or calf tenderness.   Recent Labs  04/25/13 0245 04/26/13 0624  HGB 10.3* 9.2*  HCT 30.7* 27.7*    Assessment/Plan: Plan for discharge tomorrow and Contraception considering mirena. Bottle feeding. May try breastfeeding again today.   LOS: 1 day   Levert Feinstein 04/26/2013, 9:09 AM   I have seen this patient and agree with the above resident's note.  Assisted pt with breastfeeding this morning, infant latched well and nursing.  Discussed importance of frequent feeding, skin to skin, expressing colostrum if infant not latching well.  Pt states understanding.   LEFTWICH-KIRBY, Korrey Schleicher Certified Nurse-Midwife

## 2013-04-26 NOTE — Clinical Social Work Note (Signed)
Clinical Social Work Department PSYCHOSOCIAL ASSESSMENT - MATERNAL/CHILD 04/26/2013  Patient:  Smith,Cassandra J  Account Number:  [id number]  Admit Date:  04/25/2013  Childs Name:   Lyleh Morales Smith    Clinical Social Worker:  Marga Gramajo, LCSW   Date/Time:  04/26/2013 01:00 PM  Date Referred:  04/26/2013   Referral source  Physician  RN     Referred reason  LPNC   Other referral source:    I:  FAMILY / HOME ENVIRONMENT Child's legal guardian:  PARENT  Guardian - Name Guardian - Age Guardian - Address  Cassandra Smith 17 [address] [place], [place] [address]  Cassandra Smith  [address] [place], [place] [address]   Other household support members/support persons Name Relationship DOB  2 yo daughter     Other support:   MOB reports good family support    II  PSYCHOSOCIAL DATA Information Source:  Patient Interview  Financial and Community Resources Employment:   unemployed   Financial resources:  Medicaid If Medicaid - County:  [place] Other  WIC  Food Stamps   School / Grade:   Maternity Care Coordinator / Child Services Coordination / Early Interventions:  Cultural issues impacting care:    III  STRENGTHS Strengths  Adequate Resources  Home prepared for Child (including basic supplies)  Supportive family/friends   Strength comment:    IV  RISK FACTORS AND CURRENT PROBLEMS Current Problem:  None   Risk Factor & Current Problem Patient Issue Family Issue Risk Factor / Current Problem Comment   N N     V  SOCIAL WORK ASSESSMENT CSW spoke with MOB at bedside.  CSW discussed any emotional concerns with MOB.  MOB reports no emotional concerns at this time.  CSW discussed any hx with MOB, and she reports no anxiety/depression hx.  MOB reports knowing what PPD symptoms to look out for.  CSW discussed LPNC.  MOB reported she had 5 visits during her pregnancy for care. MOB reports having to wait on medicaid which was part of her delay  in care.  MOB reports starting care at 24 weeks. CSW discussed hospital policy to drug screen.  MOB was understanding.  CSW discussed needing to return if any results were positive.  MOB reports no hx or current concerns with SA.  CSW discussed supplies and family support.  MOB reports no concerns at this time.  MOB reports having WIC and foodstamps as support with medicaid. No barriers to discharge at this time.  Please reconsult CSW if further needs arise.      VI SOCIAL WORK PLAN Social Work Plan  No Further Intervention Required / No Barriers to Discharge   Type of pt/family education:   If child protective services report - county:   If child protective services report - date:   Information/referral to community resources comment:   Other social work plan:    

## 2013-04-26 NOTE — Anesthesia Postprocedure Evaluation (Signed)
  Anesthesia Post-op Note  Patient: Cassandra Smith  Procedure(s) Performed: * No procedures listed *  Patient Location: Mother/Baby  Anesthesia Type:Epidural  Level of Consciousness: awake  Airway and Oxygen Therapy: Patient Spontanous Breathing  Post-op Pain: mild  Post-op Assessment: Patient's Cardiovascular Status Stable and Respiratory Function Stable  Post-op Vital Signs: stable  Complications: No apparent anesthesia complications

## 2013-04-27 MED ORDER — IBUPROFEN 600 MG PO TABS: 600.0000 mg | ORAL_TABLET | Freq: Four times a day (QID) | ORAL | Status: DC | PRN

## 2013-04-27 NOTE — Discharge Summary (Cosign Needed)
Obstetric Discharge Summary Reason for Admission: onset of labor Prenatal Procedures: none Intrapartum Procedures: spontaneous vaginal delivery Postpartum Procedures: none Complications-Operative and Postpartum: none  Hemoglobin  Date Value Range Status  04/26/2013 9.2* 12.0 - 15.0 g/dL Final     HCT  Date Value Range Status  04/26/2013 27.7* 36.0 - 46.0 % Final    Physical Exam:  General: alert, cooperative and no distress Lochia: appropriate Uterine Fundus: firm Incision: n/a DVT Evaluation: No evidence of DVT seen on physical exam. Negative Homan's sign. No cords or calf tenderness.  Hospital Course: Cassandra Smith is a 17 y.o. [id number] at [redacted]w[redacted]d who was admitted to the hospital after presenting for spontaneous onset of labor. Her labor was augmented with pitocin. She had a normal spontaneous vaginal delivery. After delivery, pt passed several large clots and cytotec given 800mg  PV as well as pitocin. Uterine firmed nicely and no further clots were observed. Her postpartum course was unremarkable. Social work met with patient due to limited prenatal care and identified no barriers to discharge.  Pt is breast and formula feeding and plans to use Mirena for contraception. She will follow up with the [facility] in 4-6 weeks.   Discharge Diagnoses: Term Pregnancy-delivered  Discharge Information: Date: 04/27/2013 Activity: pelvic rest Diet: routine Medications: PNV and Ibuprofen Condition: stable Instructions: refer to practice specific booklet Discharge to: home   Newborn Data: Live born female  Birth Weight: 7 lb 5.5 oz (3330 g) APGAR: 9, 9  Home with mother.  Levert Feinstein 04/27/2013, 7:15 AM  I spoke with and examined patient and agree with resident's note and plan of care.  Tawana Scale, MD OB Fellow 04/27/2013 9:06 AM

## 2013-05-01 ENCOUNTER — Encounter: Payer: Medicaid Other | Admitting: Obstetrics and Gynecology

## 2013-05-05 ENCOUNTER — Encounter: Payer: Self-pay | Admitting: Obstetrics and Gynecology

## 2013-06-09 ENCOUNTER — Ambulatory Visit: Payer: Medicaid Other | Admitting: Obstetrics and Gynecology

## 2014-01-06 ENCOUNTER — Encounter (HOSPITAL_COMMUNITY): Payer: Self-pay | Admitting: *Deleted

## 2014-01-06 ENCOUNTER — Inpatient Hospital Stay (HOSPITAL_COMMUNITY)
Admission: AD | Admit: 2014-01-06 | Discharge: 2014-01-06 | Disposition: A | Discharge status: Home / Self Care | Payer: Medicaid Other | Source: Ambulatory Visit | Attending: Obstetrics & Gynecology | Admitting: Obstetrics & Gynecology

## 2014-01-06 DIAGNOSIS — R109 Unspecified abdominal pain: Secondary | ICD-10-CM

## 2014-01-06 HISTORY — DX: Hypothyroidism, unspecified: E03.9

## 2014-01-06 LAB — URINALYSIS, ROUTINE W REFLEX MICROSCOPIC
Bilirubin Urine: NEGATIVE
GLUCOSE, UA: NEGATIVE mg/dL
Hgb urine dipstick: NEGATIVE
Ketones, ur: NEGATIVE mg/dL
LEUKOCYTES UA: NEGATIVE
Nitrite: NEGATIVE
PH: 7 (ref 5.0–8.0)
Protein, ur: NEGATIVE mg/dL
SPECIFIC GRAVITY, URINE: 1.01 (ref 1.005–1.030)
Urobilinogen, UA: 0.2 mg/dL (ref 0.0–1.0)

## 2014-01-06 LAB — WET PREP, GENITAL
CLUE CELLS WET PREP: NONE SEEN
TRICH WET PREP: NONE SEEN
Yeast Wet Prep HPF POC: NONE SEEN

## 2014-01-06 LAB — CBC
HEMATOCRIT: 37.9 % (ref 36.0–46.0)
HEMOGLOBIN: 12.7 g/dL (ref 12.0–15.0)
MCH: 29.5 pg (ref 26.0–34.0)
MCHC: 33.5 g/dL (ref 30.0–36.0)
MCV: 87.9 fL (ref 78.0–100.0)
Platelets: 285 10*3/uL (ref 150–400)
RBC: 4.31 MIL/uL (ref 3.87–5.11)
RDW: 12.8 % (ref 11.5–15.5)
WBC: 7.5 10*3/uL (ref 4.0–10.5)

## 2014-01-06 LAB — POCT PREGNANCY, URINE: Preg Test, Ur: NEGATIVE

## 2014-01-06 LAB — HCG, SERUM, QUALITATIVE: Preg, Serum: NEGATIVE

## 2014-01-06 NOTE — MAU Note (Signed)
Patient states she started having right lower abdominal pain since last night that is getting worse this am. Denies nausea, vomiting, bleeding or discharge.

## 2014-01-06 NOTE — MAU Provider Note (Signed)
Attestation of Attending Supervision of Advanced Practitioner (CNM/NP): Evaluation and management procedures were performed by the Advanced Practitioner under my supervision and collaboration.  I have reviewed the Advanced Practitioner's note and chart, and I agree with the management and plan.  Willodean Rosenthalarolyn [name] 3:58 PM

## 2014-01-06 NOTE — MAU Provider Note (Signed)
History     CSN: [id number]  Arrival date and time: 01/06/14 1246   None     Chief Complaint  Patient presents with  . Abdominal Pain   HPI Pt is not pregnant [id number] with complaint of sudden onset of RLQ pain, episodic,worse when she stands up. Pt says it feels like "gas bubble" or like kidney infection.  Pt is using condoms for contraception. Pt has not had a Period since daughter was born 8 mos ago.  Pt denies vaginal discharge, itching or burning. Pt had a normal bowel movement Prior to onset of pain.  Pt has not had nausea/vomiting or diarrhea.  Pt is not breast feeding now stopped about 6 months ago. Pt has hx of irregular periods. Pt previously had neg UPT with positive serum HCG. RN note: Garvin Filaonna C Coley, RN Registered Nurse Signed  MAU Note Service date: 01/06/2014 12:58 PM   Patient states she started having right lower abdominal pain since last night that is getting worse this am. Denies nausea, vomiting, bleeding or discharge.   Patient states she has hypothyroidism and has not had a period since her daughter was born 8 months ago.     Past Medical History  Diagnosis Date  . Enlarged thyroid   . Medical history non-contributory   . Kidney infection     before first pregnancy  . Hypothyroidism     Past Surgical History  Procedure Laterality Date  . Wisdom tooth extraction      Family History  Problem Relation Age of Onset  . Cancer Mother     kidney and gallbladder  . Cancer Maternal Aunt     ovarian  . Cancer Maternal Grandmother     ovarian    History  Substance Use Topics  . Smoking status: Never Smoker   . Smokeless tobacco: Never Used  . Alcohol Use: No    Allergies: No Known Allergies  Prescriptions prior to admission  Medication Sig Dispense Refill  . ibuprofen (ADVIL,MOTRIN) 600 MG tablet Take 1 tablet (600 mg total) by mouth every 6 (six) hours as needed for pain.  30 tablet  0  . Prenatal Vit-Fe Fumarate-FA (PRENATAL MULTIVITAMIN)  TABS Take 1 tablet by mouth at bedtime.        Review of Systems  Constitutional: Negative for fever and chills.  Gastrointestinal: Positive for abdominal pain. Negative for nausea, vomiting, diarrhea and constipation.  Genitourinary: Negative for dysuria.  Musculoskeletal: Negative for back pain.   Physical Exam   Blood pressure 131/75, pulse 64, temperature 99 F (37.2 C), temperature source Oral, resp. rate 16, height 5' 11.5" (1.816 m), weight 104.146 kg (229 lb 9.6 oz), SpO2 98.00%, unknown if currently breastfeeding.  Physical Exam  Nursing note and vitals reviewed. Constitutional: She is oriented to person, place, and time. She appears well-developed and well-nourished. No distress.  HENT:  Head: Normocephalic.  Eyes: Pupils are equal, round, and reactive to light.  Neck: Normal range of motion. Neck supple.  Cardiovascular: Normal rate.   Respiratory: Effort normal.  GI: Soft. There is tenderness. There is no rebound.  Tender right lower quadrant- mild- no rebound; No CVA tenderness  Genitourinary:  Reddened vaginal mucosa; small amount of watery white discharge; cervix closed, NT; mild right adnexa tenderness; uterus NSSC NT  Musculoskeletal: Normal range of motion.  Neurological: She is alert and oriented to person, place, and time.  Skin: Skin is warm and dry.    MAU Course  Procedures Results for orders placed  during the hospital encounter of 01/06/14 (from the past 24 hour(s))  URINALYSIS, ROUTINE W REFLEX MICROSCOPIC     Status: None   Collection Time    01/06/14  1:05 PM      Result Value Ref Range   Color, Urine YELLOW  YELLOW   APPearance CLEAR  CLEAR   Specific Gravity, Urine 1.010  1.005 - 1.030   pH 7.0  5.0 - 8.0   Glucose, UA NEGATIVE  NEGATIVE mg/dL   Hgb urine dipstick NEGATIVE  NEGATIVE   Bilirubin Urine NEGATIVE  NEGATIVE   Ketones, ur NEGATIVE  NEGATIVE mg/dL   Protein, ur NEGATIVE  NEGATIVE mg/dL   Urobilinogen, UA 0.2  0.0 - 1.0 mg/dL    Nitrite NEGATIVE  NEGATIVE   Leukocytes, UA NEGATIVE  NEGATIVE  POCT PREGNANCY, URINE     Status: None   Collection Time    01/06/14  1:36 PM      Result Value Ref Range   Preg Test, Ur NEGATIVE  NEGATIVE  CBC     Status: None   Collection Time    01/06/14  1:42 PM      Result Value Ref Range   WBC 7.5  4.0 - 10.5 K/uL   RBC 4.31  3.87 - 5.11 MIL/uL   Hemoglobin 12.7  12.0 - 15.0 g/dL   HCT 96.037.9  45.436.0 - 09.846.0 %   MCV 87.9  78.0 - 100.0 fL   MCH 29.5  26.0 - 34.0 pg   MCHC 33.5  30.0 - 36.0 g/dL   RDW 11.912.8  14.711.5 - 82.915.5 %   Platelets 285  150 - 400 K/uL  HCG, SERUM, QUALITATIVE     Status: None   Collection Time    01/06/14  1:42 PM      Result Value Ref Range   Preg, Serum NEGATIVE  NEGATIVE  WET PREP, GENITAL     Status: Abnormal   Collection Time    01/06/14  2:50 PM      Result Value Ref Range   Yeast Wet Prep HPF POC NONE SEEN  NONE SEEN   Trich, Wet Prep NONE SEEN  NONE SEEN   Clue Cells Wet Prep HPF POC NONE SEEN  NONE SEEN   WBC, Wet Prep HPF POC MODERATE (*) NONE SEEN    Assessment and Plan  abd pain Amenorrhea with hx of irregular periods F/u with gynecologist of choice  Jean RosenthalSusan P Anner Baity 01/06/2014, 1:25 PM

## 2014-01-06 NOTE — MAU Note (Signed)
Patient states she has hypothyroidism and has not had a period since her daughter was born 8 months ago.

## 2014-01-07 LAB — GC/CHLAMYDIA PROBE AMP
CT PROBE, AMP APTIMA: NEGATIVE
GC PROBE AMP APTIMA: NEGATIVE

## 2014-06-29 ENCOUNTER — Encounter (HOSPITAL_COMMUNITY): Payer: Self-pay | Admitting: *Deleted

## 2021-08-21 ENCOUNTER — Encounter (HOSPITAL_COMMUNITY): Payer: Self-pay | Admitting: Obstetrics and Gynecology

## 2021-08-21 ENCOUNTER — Inpatient Hospital Stay (HOSPITAL_COMMUNITY)
Admission: AD | Admit: 2021-08-21 | Discharge: 2021-08-22 | Disposition: A | Discharge status: Home / Self Care | Payer: Medicaid Other | Attending: Obstetrics and Gynecology | Admitting: Obstetrics and Gynecology

## 2021-08-21 ENCOUNTER — Other Ambulatory Visit: Payer: Self-pay

## 2021-08-21 DIAGNOSIS — Z3A10 10 weeks gestation of pregnancy: Secondary | ICD-10-CM | POA: Insufficient documentation

## 2021-08-21 DIAGNOSIS — Z20822 Contact with and (suspected) exposure to covid-19: Secondary | ICD-10-CM | POA: Diagnosis not present

## 2021-08-21 DIAGNOSIS — R509 Fever, unspecified: Secondary | ICD-10-CM | POA: Insufficient documentation

## 2021-08-21 DIAGNOSIS — R0981 Nasal congestion: Secondary | ICD-10-CM | POA: Insufficient documentation

## 2021-08-21 DIAGNOSIS — R059 Cough, unspecified: Secondary | ICD-10-CM | POA: Insufficient documentation

## 2021-08-21 DIAGNOSIS — R109 Unspecified abdominal pain: Secondary | ICD-10-CM

## 2021-08-21 DIAGNOSIS — R519 Headache, unspecified: Secondary | ICD-10-CM | POA: Diagnosis not present

## 2021-08-21 DIAGNOSIS — O26891 Other specified pregnancy related conditions, first trimester: Secondary | ICD-10-CM | POA: Diagnosis not present

## 2021-08-21 DIAGNOSIS — R102 Pelvic and perineal pain: Secondary | ICD-10-CM | POA: Insufficient documentation

## 2021-08-21 DIAGNOSIS — O219 Vomiting of pregnancy, unspecified: Secondary | ICD-10-CM | POA: Diagnosis not present

## 2021-08-21 DIAGNOSIS — N96 Recurrent pregnancy loss: Secondary | ICD-10-CM | POA: Diagnosis not present

## 2021-08-21 HISTORY — DX: Headache, unspecified: R51.9

## 2021-08-21 LAB — COMPREHENSIVE METABOLIC PANEL
ALT: 14 U/L (ref 0–44)
AST: 14 U/L — ABNORMAL LOW (ref 15–41)
Albumin: 4.2 g/dL (ref 3.5–5.0)
Alkaline Phosphatase: 55 U/L (ref 38–126)
Anion gap: 8 (ref 5–15)
BUN: 8 mg/dL (ref 6–20)
CO2: 23 mmol/L (ref 22–32)
Calcium: 9.5 mg/dL (ref 8.9–10.3)
Chloride: 103 mmol/L (ref 98–111)
Creatinine, Ser: 0.72 mg/dL (ref 0.44–1.00)
GFR, Estimated: >60 mL/min (ref >60)
Glucose, Bld: 99 mg/dL (ref 70–99)
Potassium: 3.9 mmol/L (ref 3.5–5.1)
Sodium: 134 mmol/L — ABNORMAL LOW (ref 135–145)
Total Bilirubin: 0.7 mg/dL (ref 0.3–1.2)
Total Protein: 7.3 g/dL (ref 6.5–8.1)

## 2021-08-21 LAB — CBC WITH DIFFERENTIAL/PLATELET
Abs Immature Granulocytes: 0.04 10*3/uL (ref 0.00–0.07)
Basophils Absolute: 0.1 10*3/uL (ref 0.0–0.1)
Basophils Relative: 0 %
Eosinophils Absolute: 0.1 10*3/uL (ref 0.0–0.5)
Eosinophils Relative: 0 %
HCT: 40.8 % (ref 36.0–46.0)
Hemoglobin: 13.7 g/dL (ref 12.0–15.0)
Immature Granulocytes: 0 %
Lymphocytes Relative: 12 %
Lymphs Abs: 2 10*3/uL (ref 0.7–4.0)
MCH: 31 pg (ref 26.0–34.0)
MCHC: 33.6 g/dL (ref 30.0–36.0)
MCV: 92.3 fL (ref 80.0–100.0)
Monocytes Absolute: 1.1 10*3/uL — ABNORMAL HIGH (ref 0.1–1.0)
Monocytes Relative: 6 %
Neutro Abs: 13.2 10*3/uL — ABNORMAL HIGH (ref 1.7–7.7)
Neutrophils Relative %: 82 %
Platelets: 262 10*3/uL (ref 150–400)
RBC: 4.42 MIL/uL (ref 3.87–5.11)
RDW: 11.9 % (ref 11.5–15.5)
WBC: 16.3 10*3/uL — ABNORMAL HIGH (ref 4.0–10.5)
nRBC: 0 % (ref 0.0–0.2)

## 2021-08-21 LAB — URINALYSIS, ROUTINE W REFLEX MICROSCOPIC
Bilirubin Urine: NEGATIVE
Glucose, UA: NEGATIVE mg/dL
Hgb urine dipstick: NEGATIVE
Ketones, ur: >80 mg/dL — AB
Nitrite: NEGATIVE
Protein, ur: 30 mg/dL — AB
Specific Gravity, Urine: >1.03 — ABNORMAL HIGH (ref 1.005–1.030)
pH: 6.5 (ref 5.0–8.0)

## 2021-08-21 LAB — URINALYSIS, MICROSCOPIC (REFLEX)

## 2021-08-21 LAB — I-STAT BETA HCG BLOOD, ED (MC, WL, AP ONLY): I-stat hCG, quantitative: >2000 m[IU]/mL — ABNORMAL HIGH (ref <5)

## 2021-08-21 LAB — POC URINE PREG, ED: Preg Test, Ur: POSITIVE — AB

## 2021-08-21 NOTE — ED Triage Notes (Signed)
Pt c/o abd pain, nausea, vomiting and headache for the past few days.

## 2021-08-21 NOTE — MAU Note (Signed)
Cassandra Smith is a 25 y.o. at [redacted]w[redacted]d here in MAU reporting: pt reports feeling weak with headache, dizzy nausea with emesis. Fever earlier in week. Reports sharp stabbing pain in abdomen began 5 days ago and is intermittent. Pt denies bleeding or vaginal discharge.   Vitals:   08/21/21 2323 08/21/21 2324  BP:  132/87  Pulse:  67  Resp:  17  Temp:  98.5 F (36.9 C)  SpO2: 97% 95%

## 2021-08-21 NOTE — MAU Provider Note (Signed)
History    [id number]  Arrival date and time: 08/21/21 1853  Chief Complaint  Patient presents with   Abdominal Pain   Nausea   Emesis   Headache   HPI Cassandra Smith is a 25 y.o. at [redacted]w[redacted]d who presents to MAU with abdominal pain, nausea, headache, and vomiting. Patient states she has been having symptoms for the last 7-9 days. She is now unable to keep anything down. Her abdomen is sore from all of the vomiting, but she is also having intermittent sharp pains in her lower abdomen. This pain is brief in nature and goes away within a minute. She denies vaginal bleeding or vaginal discharge. She is concerned because she has a history of a miscarriage and had similar symptoms then too. She reports some congestion and cough this week and a low grade fever of 100.2. These have resolved. She denies dysuria. She has a headache now from everything going on and is hoping to get something to help with this as well. She is working on establishing with an OB/GYN office for this pregnancy. She has a visit planned for later this week.   Vaginal bleeding: No LOF: No Fetal Movement: No Contractions: No  OB History     Gravida  4   Para  2   Term  2   Preterm  0   AB  1   Living  2      SAB  1   IAB  0   Ectopic  0   Multiple  0   Live Births  1           Past Medical History:  Diagnosis Date   Enlarged thyroid    Headache    Hypothyroidism    Kidney infection    before first pregnancy    Past Surgical History:  Procedure Laterality Date   WISDOM TOOTH EXTRACTION      Family History  Problem Relation Age of Onset   Cancer Mother        kidney and gallbladder   Cancer Maternal Aunt        ovarian   Cancer Maternal Grandmother        ovarian    Social History   Socioeconomic History   Marital status: Married    Spouse name: Not on file   Number of children: Not on file   Years of education: Not on file   Highest education level: Not on file   Occupational History   Not on file  Tobacco Use   Smoking status: Never   Smokeless tobacco: Never  Vaping Use   Vaping Use: Never used  Substance and Sexual Activity   Alcohol use: No   Drug use: No   Sexual activity: Yes    Birth control/protection: Condom  Other Topics Concern   Not on file  Social History Narrative   Not on file   Social Determinants of Health   Financial Resource Strain: Not on file  Food Insecurity: Not on file  Transportation Needs: Not on file  Physical Activity: Not on file  Stress: Not on file  Social Connections: Not on file  Intimate Partner Violence: Not on file    Allergies  Allergen Reactions   Hydrocodone Nausea And Vomiting and Other (See Comments)    Also causes dizziness   Zofran [Ondansetron] Other (See Comments)    Pt has history of migraines and report zofran causes severe migraine    No current facility-administered medications  on file prior to encounter.   Current Outpatient Medications on File Prior to Encounter  Medication Sig Dispense Refill   Prenatal Vit-Fe Fumarate-FA (PRENATAL VITAMINS PO) Take 1 tablet by mouth daily.     ROS Pertinent positives and negative per HPI, all others reviewed and negative.  Physical Exam   BP 132/87 (BP Location: Left Arm)    Pulse 67    Temp 98.5 F (36.9 C) (Oral)    Resp 17    Ht 6' 0.01" (1.829 m)    Wt 95.1 kg    SpO2 95%    BMI 28.42 kg/m   Physical Exam Constitutional:      General: She is not in acute distress.    Appearance: She is not toxic-appearing.  HENT:     Head: Normocephalic and atraumatic.     Mouth/Throat:     Comments: Mildly dry mucus membranes Eyes:     General: No scleral icterus.    Extraocular Movements: Extraocular movements intact.  Cardiovascular:     Rate and Rhythm: Normal rate.  Pulmonary:     Effort: Pulmonary effort is normal.  Abdominal:     Palpations: Abdomen is soft.     Tenderness: There is generalized abdominal tenderness (mild).  There is no guarding or rebound.  Musculoskeletal:     Cervical back: Normal range of motion.  Skin:    General: Skin is warm and dry.  Neurological:     General: No focal deficit present.     Mental Status: She is alert and oriented to person, place, and time.  Psychiatric:        Mood and Affect: Mood normal.        Behavior: Behavior normal.   Labs Results for orders placed or performed during the hospital encounter of 08/21/21 (from the past 24 hour(s))  Urinalysis, Routine w reflex microscopic Urine, Clean Catch     Status: Abnormal   Collection Time: 08/21/21  8:56 PM  Result Value Ref Range   Color, Urine YELLOW YELLOW   APPearance CLOUDY (A) CLEAR   Specific Gravity, Urine >1.030 (H) 1.005 - 1.030   pH 6.5 5.0 - 8.0   Glucose, UA NEGATIVE NEGATIVE mg/dL   Hgb urine dipstick NEGATIVE NEGATIVE   Bilirubin Urine NEGATIVE NEGATIVE   Ketones, ur >80 (A) NEGATIVE mg/dL   Protein, ur 30 (A) NEGATIVE mg/dL   Nitrite NEGATIVE NEGATIVE   Leukocytes,Ua TRACE (A) NEGATIVE  Urinalysis, Microscopic (reflex)     Status: Abnormal   Collection Time: 08/21/21  8:56 PM  Result Value Ref Range   RBC / HPF 11-20 0 - 5 RBC/hpf   WBC, UA 11-20 0 - 5 WBC/hpf   Bacteria, UA FEW (A) NONE SEEN   Squamous Epithelial / LPF 21-50 0 - 5   Mucus PRESENT   I-Stat Beta hCG blood, ED ([facility], [facility], [facility] only)     Status: Abnormal   Collection Time: 08/21/21  9:01 PM  Result Value Ref Range   I-stat hCG, quantitative >2,000.0 (H) <5 mIU/mL   Comment 3          POC urine preg, ED (not at [facility])     Status: Abnormal   Collection Time: 08/21/21  9:05 PM  Result Value Ref Range   Preg Test, Ur POSITIVE (A) NEGATIVE  Comprehensive metabolic panel     Status: Abnormal   Collection Time: 08/21/21  9:20 PM  Result Value Ref Range   Sodium 134 (L) 135 - 145  mmol/L   Potassium 3.9 3.5 - 5.1 mmol/L   Chloride 103 98 - 111 mmol/L   CO2 23 22 - 32 mmol/L   Glucose, Bld 99 70 - 99 mg/dL   BUN 8 6 - 20 mg/dL    Creatinine, Ser 1.02 0.44 - 1.00 mg/dL   Calcium 9.5 8.9 - 72.5 mg/dL   Total Protein 7.3 6.5 - 8.1 g/dL   Albumin 4.2 3.5 - 5.0 g/dL   AST 14 (L) 15 - 41 U/L   ALT 14 0 - 44 U/L   Alkaline Phosphatase 55 38 - 126 U/L   Total Bilirubin 0.7 0.3 - 1.2 mg/dL   GFR, Estimated >36 >64 mL/min   Anion gap 8 5 - 15  CBC with Differential     Status: Abnormal   Collection Time: 08/21/21  9:20 PM  Result Value Ref Range   WBC 16.3 (H) 4.0 - 10.5 K/uL   RBC 4.42 3.87 - 5.11 MIL/uL   Hemoglobin 13.7 12.0 - 15.0 g/dL   HCT 40.3 47.4 - 25.9 %   MCV 92.3 80.0 - 100.0 fL   MCH 31.0 26.0 - 34.0 pg   MCHC 33.6 30.0 - 36.0 g/dL   RDW 56.3 87.5 - 64.3 %   Platelets 262 150 - 400 K/uL   nRBC 0.0 0.0 - 0.2 %   Neutrophils Relative % 82 %   Neutro Abs 13.2 (H) 1.7 - 7.7 K/uL   Lymphocytes Relative 12 %   Lymphs Abs 2.0 0.7 - 4.0 K/uL   Monocytes Relative 6 %   Monocytes Absolute 1.1 (H) 0.1 - 1.0 K/uL   Eosinophils Relative 0 %   Eosinophils Absolute 0.1 0.0 - 0.5 K/uL   Basophils Relative 0 %   Basophils Absolute 0.1 0.0 - 0.1 K/uL   Immature Granulocytes 0 %   Abs Immature Granulocytes 0.04 0.00 - 0.07 K/uL  Wet prep, genital     Status: None   Collection Time: 08/21/21 11:34 PM   Specimen: PATH Cytology Cervicovaginal Ancillary Only  Result Value Ref Range   Yeast Wet Prep HPF POC NONE SEEN NONE SEEN   Trich, Wet Prep NONE SEEN NONE SEEN   Clue Cells Wet Prep HPF POC NONE SEEN NONE SEEN   WBC, Wet Prep HPF POC <10 <10   Sperm NONE SEEN     Imaging No results found.  MAU Course  Procedures Lab Orders         Resp Panel by RT-PCR (Flu A&B, Covid) Nasopharyngeal Swab         Wet prep, genital         Comprehensive metabolic panel         CBC with Differential         Urinalysis, Routine w reflex microscopic         Urinalysis, Microscopic (reflex)         I-Stat Beta hCG blood, ED ([facility], [facility], [facility] only)         POC urine preg, ED (not at [facility])    Meds ordered this encounter   Medications   promethazine (PHENERGAN) 25 mg in sodium chloride 0.9 % 50 mL IVPB   pantoprazole (PROTONIX) injection 40 mg   sodium chloride 0.9 % bolus 1,000 mL   Imaging Orders         US OB LESS THAN 14 WEEKS WITH OB TRANSVAGINAL     MDM Patient presents with primarily nausea/vomiting  Reports recent cough, congestion, and low  grade fever as well, though these have improved Intermittent sharp lower abdominal pain, no bleeding, vaginal discharge, or dysuria Upon arrival, VSS, no distress  Abdominal exam benign  UA with > 80 ketones, no nitrites, trace leukocytes CMP normal, CBC with leukocytosis but otherwise normal COVID/Flu negative Wet prep negative, GC/CT pending  OB ultrasound obtained and confirmed IUP, HR 140 bpm Treated with IVF, IV Phenergan, and IV Protonix with significant improvement in symptoms  Tylenol given for headache with improvement  Able to tolerate crackers and fluids in MAU without issue Stable for discharge   Assessment and Plan   1. Abdominal pain in pregnancy 2. Nausea/vomiting in pregnancy 3. Headache in pregnancy - Improved with treatments above  - IUP confirmed  - Recommended discharge with further outpatient management - Prescribed Phenergan as needed for nausea/vomiting - Strict return precautions reviewed should her symptoms worsen or fail to improve and patient voiced understanding   Worthy Rancher, MD

## 2021-08-21 NOTE — ED Provider Notes (Signed)
Emergency Medicine Provider Triage Evaluation Note  Cassandra Smith , a 25 y.o. female  was evaluated in triage.  Pt complains of 8-day history of vomiting, intermittent fevers with T-max of 102 F.  Patient is pregnant with [redacted] weeks gestation.  She is also complaining of diffuse pelvic pain without vaginal bleeding or discharge.  Patient had a miscarriage in February 2021 and is concerned that it is happening again.  No known sick contacts, however says her young daughter at home may have a cold.  No urinary symptoms  Review of Systems  Positive: Vomiting, pelvic pain, diarrhea, fever, pregnancy Negative: Urinary symptoms, chest pain, headache  Physical Exam  BP (!) 148/94 (BP Location: Right Arm)    Pulse 72    Temp 98.8 F (37.1 C) (Oral)    Resp 16    Ht 6' (1.829 m)    Wt 99.3 kg    SpO2 98%    BMI 29.70 kg/m  Gen:   Awake, she is emotional and quite distressed Resp:  Normal effort  MSK:   Moves extremities without difficulty  Other:  Skin is warm to touch, tender to palpation bilateral pelvic region.  Stomach otherwise soft nondistended  Medical Decision Making  Medically screening exam initiated at 8:52 PM.  Appropriate orders placed.  AMULYA QUINTIN was informed that the remainder of the evaluation will be completed by another provider, this initial triage assessment does not replace that evaluation, and the importance of remaining in the ED until their evaluation is complete.  Ordered labs as well as i-STAT pregnancy test so patient can be transferred to [facility]   Delight Ovens 08/21/21 2112    Terald Sleeper, MD 08/22/21 [phone]

## 2021-08-22 ENCOUNTER — Inpatient Hospital Stay (HOSPITAL_COMMUNITY): Payer: Medicaid Other

## 2021-08-22 LAB — WET PREP, GENITAL
Clue Cells Wet Prep HPF POC: NONE SEEN
Sperm: NONE SEEN
Trich, Wet Prep: NONE SEEN
WBC, Wet Prep HPF POC: <10 (ref <10)
Yeast Wet Prep HPF POC: NONE SEEN

## 2021-08-22 LAB — RESP PANEL BY RT-PCR (FLU A&B, COVID) ARPGX2
Influenza A by PCR: NEGATIVE
Influenza B by PCR: NEGATIVE
SARS Coronavirus 2 by RT PCR: NEGATIVE

## 2021-08-22 MED ORDER — SODIUM CHLORIDE 0.9 % IV BOLUS
1000.0000 mL | Freq: Once | INTRAVENOUS | Status: AC
  Administered 2021-08-22: 01:00:00 1000 mL via INTRAVENOUS

## 2021-08-22 MED ORDER — PANTOPRAZOLE SODIUM 40 MG IV SOLR
40.0000 mg | Freq: Once | INTRAVENOUS | Status: AC
  Administered 2021-08-22: 02:00:00 40 mg via INTRAVENOUS
  Filled 2021-08-22: qty 40

## 2021-08-22 MED ORDER — ACETAMINOPHEN 500 MG PO TABS
1000.0000 mg | ORAL_TABLET | Freq: Once | ORAL | Status: AC
  Administered 2021-08-22: 04:00:00 1000 mg via ORAL
  Filled 2021-08-22: qty 2

## 2021-08-22 MED ORDER — SODIUM CHLORIDE 0.9 % IV SOLN
25.0000 mg | Freq: Once | INTRAVENOUS | Status: AC
  Administered 2021-08-22: 01:00:00 25 mg via INTRAVENOUS
  Filled 2021-08-22: qty 1

## 2021-08-22 MED ORDER — PROMETHAZINE HCL 12.5 MG PO TABS: 12.5000 mg | ORAL_TABLET | Freq: Four times a day (QID) | ORAL | 0 refills | Status: DC | PRN

## 2021-08-22 NOTE — Discharge Instructions (Signed)
Eat small, frequent meals. Taken nausea medication as needed.   Return to MAU if your symptoms worsen or do not improve with treatment.

## 2021-08-22 NOTE — MAU Note (Signed)
Pt reports nausea has improved but headache remains on L posterior side of her head.

## 2021-08-23 LAB — GC/CHLAMYDIA PROBE AMP (~~LOC~~) NOT AT ARMC
Chlamydia: NEGATIVE
Comment: NEGATIVE
Comment: NORMAL
Neisseria Gonorrhea: NEGATIVE

## 2021-09-08 ENCOUNTER — Encounter (HOSPITAL_COMMUNITY): Payer: Self-pay | Admitting: Obstetrics & Gynecology

## 2021-09-08 ENCOUNTER — Inpatient Hospital Stay (HOSPITAL_COMMUNITY)
Admission: AD | Admit: 2021-09-08 | Discharge: 2021-09-09 | Disposition: A | Discharge status: Home / Self Care | Payer: Medicaid Other | Attending: Obstetrics & Gynecology | Admitting: Obstetrics & Gynecology

## 2021-09-08 DIAGNOSIS — Z3A12 12 weeks gestation of pregnancy: Secondary | ICD-10-CM | POA: Diagnosis not present

## 2021-09-08 DIAGNOSIS — Z3A13 13 weeks gestation of pregnancy: Secondary | ICD-10-CM | POA: Diagnosis not present

## 2021-09-08 DIAGNOSIS — O99281 Endocrine, nutritional and metabolic diseases complicating pregnancy, first trimester: Secondary | ICD-10-CM | POA: Insufficient documentation

## 2021-09-08 DIAGNOSIS — O219 Vomiting of pregnancy, unspecified: Secondary | ICD-10-CM | POA: Insufficient documentation

## 2021-09-08 DIAGNOSIS — E860 Dehydration: Secondary | ICD-10-CM | POA: Diagnosis not present

## 2021-09-08 LAB — URINALYSIS, ROUTINE W REFLEX MICROSCOPIC
Bacteria, UA: NONE SEEN
Bilirubin Urine: NEGATIVE
Glucose, UA: NEGATIVE mg/dL
Hgb urine dipstick: NEGATIVE
Ketones, ur: 80 mg/dL — AB
Nitrite: NEGATIVE
Protein, ur: 30 mg/dL — AB
Specific Gravity, Urine: 1.031 — ABNORMAL HIGH (ref 1.005–1.030)
pH: 5 (ref 5.0–8.0)

## 2021-09-08 MED ORDER — LACTATED RINGERS IV SOLN: Freq: Once | INTRAVENOUS | Status: AC

## 2021-09-08 MED ORDER — METOCLOPRAMIDE HCL 5 MG/ML IJ SOLN
10.0000 mg | Freq: Once | INTRAMUSCULAR | Status: AC
  Administered 2021-09-08: 10 mg via INTRAVENOUS
  Filled 2021-09-08: qty 2

## 2021-09-08 MED ORDER — PYRIDOXINE HCL 100 MG/ML IJ SOLN
100.0000 mg | Freq: Once | INTRAMUSCULAR | Status: AC
  Administered 2021-09-08: 100 mg via INTRAVENOUS
  Filled 2021-09-08: qty 1

## 2021-09-08 MED ORDER — DIPHENHYDRAMINE HCL 50 MG/ML IJ SOLN
25.0000 mg | Freq: Once | INTRAMUSCULAR | Status: AC
  Administered 2021-09-08: 25 mg via INTRAVENOUS
  Filled 2021-09-08: qty 1

## 2021-09-08 NOTE — MAU Note (Signed)
PT SAYS SHE WENT TO DR 2 WEEKS AGO- SAYS SHE'S NOT 12.6 WEEKS  BUT 9 WEEKS  HAS BEEN VOMITING ALL PREG HAD PHENERGAN AT HOME BUT RAN OUT - AND SAYS DOESN'T WORK

## 2021-09-08 NOTE — MAU Provider Note (Signed)
Chief Complaint: Emesis   Event Date/Time   First Provider Initiated Contact with Patient 09/08/21 2318        SUBJECTIVE HPI: Cassandra Smith is a 26 y.o. [id number] at [redacted]w[redacted]d by LMP who presents to maternity admissions reporting nausea and vomiting.  Had been using Phenergan but it was not always helping, and then she ran out of them. . She denies vaginal bleeding, dizziness, or fever/chills.    Emesis  This is a recurrent problem. There has been no fever. Pertinent negatives include no abdominal pain, chills, diarrhea, dizziness or fever. She has tried nothing for the symptoms.  RN Note: PT SAYS SHE WENT TO DR 2 WEEKS AGO- SAYS SHE'S NOT 12.6 WEEKS  BUT 9 WEEKS  HAS BEEN VOMITING ALL PREG HAD PHENERGAN AT HOME BUT RAN OUT - AND SAYS DOESN'T WORK   Past Medical History:  Diagnosis Date   Enlarged thyroid    Headache    Hypothyroidism    Kidney infection    before first pregnancy   Past Surgical History:  Procedure Laterality Date   WISDOM TOOTH EXTRACTION     Social History   Socioeconomic History   Marital status: Married    Spouse name: Not on file   Number of children: Not on file   Years of education: Not on file   Highest education level: Not on file  Occupational History   Not on file  Tobacco Use   Smoking status: Never   Smokeless tobacco: Never  Vaping Use   Vaping Use: Never used  Substance and Sexual Activity   Alcohol use: No   Drug use: No   Sexual activity: Yes    Birth control/protection: Condom  Other Topics Concern   Not on file  Social History Narrative   Not on file   Social Determinants of Health   Financial Resource Strain: Not on file  Food Insecurity: Not on file  Transportation Needs: Not on file  Physical Activity: Not on file  Stress: Not on file  Social Connections: Not on file  Intimate Partner Violence: Not on file   No current facility-administered medications on file prior to encounter.   Current Outpatient  Medications on File Prior to Encounter  Medication Sig Dispense Refill   Prenatal Vit-Fe Fumarate-FA (PRENATAL VITAMINS PO) Take 1 tablet by mouth daily.     promethazine (PHENERGAN) 12.5 MG tablet Take 1 tablet (12.5 mg total) by mouth every 6 (six) hours as needed for nausea or vomiting. 30 tablet 0   Allergies  Allergen Reactions   Hydrocodone Nausea And Vomiting and Other (See Comments)    Also causes dizziness   Zofran [Ondansetron] Other (See Comments)    Pt has history of migraines and report zofran causes severe migraine    I have reviewed patient's Past Medical Hx, Surgical Hx, Family Hx, Social Hx, medications and allergies.   ROS:  Review of Systems  Constitutional:  Negative for chills and fever.  Gastrointestinal:  Positive for vomiting. Negative for abdominal pain and diarrhea.  Neurological:  Negative for dizziness.  Review of Systems  Other systems negative   Physical Exam  Physical Exam Patient Vitals for the past 24 hrs:  BP Temp Temp src Pulse Resp Height Weight  09/08/21 2216 130/87 99.1 F (37.3 C) Oral (!) 109 20 6' (1.829 m) 91.4 kg   Constitutional: Well-developed, well-nourished female in no acute distress.  Cardiovascular: normal rate Respiratory: normal effort GI: Abd soft, non-tender. MS: Extremities nontender, no  edema, normal ROM Neurologic: Alert and oriented x 4.  GU: Neg CVAT.  LAB RESULTS Results for orders placed or performed during the hospital encounter of 09/08/21 (from the past 24 hour(s))  Urinalysis, Routine w reflex microscopic Urine, Clean Catch     Status: Abnormal   Collection Time: 09/08/21 10:29 PM  Result Value Ref Range   Color, Urine AMBER (A) YELLOW   APPearance HAZY (A) CLEAR   Specific Gravity, Urine 1.031 (H) 1.005 - 1.030   pH 5.0 5.0 - 8.0   Glucose, UA NEGATIVE NEGATIVE mg/dL   Hgb urine dipstick NEGATIVE NEGATIVE   Bilirubin Urine NEGATIVE NEGATIVE   Ketones, ur 80 (A) NEGATIVE mg/dL   Protein, ur 30 (A)  NEGATIVE mg/dL   Nitrite NEGATIVE NEGATIVE   Leukocytes,Ua SMALL (A) NEGATIVE   RBC / HPF 6-10 0 - 5 RBC/hpf   WBC, UA 6-10 0 - 5 WBC/hpf   Bacteria, UA NONE SEEN NONE SEEN   Squamous Epithelial / LPF 6-10 0 - 5   Mucus PRESENT        IMAGING   MAU Management/MDM: Ordered IV fluids for hydration Reglan, benadryl, vitamin B6 given for nausea We gave her two liters of fluids She felt much better after this treatment Discussed I will refill her Phenergan and add Reglan and B6 as alternatives.   ASSESSMENT Single IUP at [redacted]w[redacted]d Nausea and vomiting Dehydraton  PLAN Discharge home Rx Reglan for nausea Rx Vitamin B6 for nausea  Pt stable at time of discharge. Encouraged to return here if she develops worsening of symptoms, increase in pain, fever, or other concerning symptoms.    Wynelle Bourgeois CNM, MSN Certified Nurse-Midwife 09/08/2021  11:18 PM

## 2021-09-09 ENCOUNTER — Other Ambulatory Visit (INDEPENDENT_AMBULATORY_CARE_PROVIDER_SITE_OTHER): Payer: Medicaid Other | Admitting: Obstetrics and Gynecology

## 2021-09-09 DIAGNOSIS — O219 Vomiting of pregnancy, unspecified: Secondary | ICD-10-CM

## 2021-09-09 DIAGNOSIS — Z3A12 12 weeks gestation of pregnancy: Secondary | ICD-10-CM

## 2021-09-09 MED ORDER — VITAMIN B-6 50 MG PO TABS: 50.0000 mg | ORAL_TABLET | Freq: Every day | ORAL | 2 refills | Status: AC

## 2021-09-09 MED ORDER — VITAMIN B-6 50 MG PO TABS: 50.0000 mg | ORAL_TABLET | Freq: Every day | ORAL | 2 refills | Status: DC

## 2021-09-09 MED ORDER — METOCLOPRAMIDE HCL 10 MG PO TABS: 10.0000 mg | ORAL_TABLET | Freq: Four times a day (QID) | ORAL | 0 refills | Status: AC

## 2021-09-09 MED ORDER — PROMETHAZINE HCL 25 MG PO TABS: 25.0000 mg | ORAL_TABLET | Freq: Four times a day (QID) | ORAL | 2 refills | Status: DC | PRN

## 2021-09-09 MED ORDER — METOCLOPRAMIDE HCL 10 MG PO TABS: 10.0000 mg | ORAL_TABLET | Freq: Four times a day (QID) | ORAL | 0 refills | Status: DC

## 2021-09-09 MED ORDER — PROMETHAZINE HCL 25 MG PO TABS: 25.0000 mg | ORAL_TABLET | Freq: Four times a day (QID) | ORAL | 2 refills | Status: AC | PRN

## 2021-09-09 NOTE — Progress Notes (Signed)
TC from patient's spouse reporting medications were not at the pharmacy. Rx resent to [facility] on [address].  Raelyn Mora, CNM

## 2021-09-09 NOTE — Discharge Instructions (Signed)
[facility] Providers   [facility]             [address], [place], [place] [address] [phone]  [facility]                                                             [address], [address], [place], [place], [address] [phone]  [facility]                                    [address], [address], [place], [place], [address] [phone]  [facility] [place] [address], [address], [place], [place], [address] [phone]  [facility]                                 [address], [place], [place], [address] [phone]  [facility]                                    [address], [place], [place], [address] [phone]  [facility] [address], [address], [place], , [address]                              [facility] [address], [place], , [address] [phone]  [facility]         Phone: [phone]  [facility]      Phone: [phone]   [facility]      Phone: [phone]  [facility]         Phone: [phone]   [facility]    Phone: [phone]  [facility]      Phone: [phone]  [facility]     Phone: [phone]  [facility]      Phone: [phone]
